# Patient Record
Sex: Female | Born: 1990 | Race: White | Hispanic: No | Marital: Single | Smoking: Never smoker
Health system: Southern US, Community
[De-identification: ages and names within clinical notes are randomized; demographics above are authoritative.]

## PROBLEM LIST (undated history)

## (undated) DIAGNOSIS — J45990 Exercise induced bronchospasm: Secondary | ICD-10-CM

## (undated) HISTORY — DX: Exercise induced bronchospasm: J45.990

## (undated) HISTORY — PX: NO PAST SURGERIES: SHX2092

## (undated) HISTORY — PX: TYMPANOPLASTY: SHX33

---

## 2016-09-08 ENCOUNTER — Encounter: Payer: Self-pay | Admitting: Obstetrics and Gynecology

## 2016-09-15 ENCOUNTER — Encounter: Payer: Self-pay | Admitting: Obstetrics and Gynecology

## 2016-09-29 ENCOUNTER — Encounter: Payer: Self-pay | Admitting: Obstetrics and Gynecology

## 2016-09-29 ENCOUNTER — Ambulatory Visit (INDEPENDENT_AMBULATORY_CARE_PROVIDER_SITE_OTHER): Payer: BC Managed Care – PPO | Admitting: Obstetrics and Gynecology

## 2016-09-29 VITALS — BP 114/77 | HR 71 | Ht 68.0 in | Wt 270.5 lb

## 2016-09-29 DIAGNOSIS — Z01419 Encounter for gynecological examination (general) (routine) without abnormal findings: Secondary | ICD-10-CM | POA: Diagnosis not present

## 2016-09-29 DIAGNOSIS — L729 Follicular cyst of the skin and subcutaneous tissue, unspecified: Secondary | ICD-10-CM

## 2016-09-29 DIAGNOSIS — Z6841 Body Mass Index (BMI) 40.0 and over, adult: Secondary | ICD-10-CM

## 2016-09-29 DIAGNOSIS — Z13220 Encounter for screening for lipoid disorders: Secondary | ICD-10-CM

## 2016-09-29 DIAGNOSIS — J4520 Mild intermittent asthma, uncomplicated: Secondary | ICD-10-CM | POA: Insufficient documentation

## 2016-09-29 DIAGNOSIS — Z8349 Family history of other endocrine, nutritional and metabolic diseases: Secondary | ICD-10-CM

## 2016-09-29 NOTE — Patient Instructions (Addendum)
Health Maintenance, Female Adopting a healthy lifestyle and getting preventive care can go a long way to promote health and wellness. Talk with your health care provider about what schedule of regular examinations is right for you. This is a good chance for you to check in with your provider about disease prevention and staying healthy. In between checkups, there are plenty of things you can do on your own. Experts have done a lot of research about which lifestyle changes and preventive measures are most likely to keep you healthy. Ask your health care provider for more information. Weight and diet Eat a healthy diet  Be sure to include plenty of vegetables, fruits, low-fat dairy products, and lean protein.  Do not eat a lot of foods high in solid fats, added sugars, or salt.  Get regular exercise. This is one of the most important things you can do for your health.  Most adults should exercise for at least 150 minutes each week. The exercise should increase your heart rate and make you sweat (moderate-intensity exercise).  Most adults should also do strengthening exercises at least twice a week. This is in addition to the moderate-intensity exercise. Maintain a healthy weight  Body mass index (BMI) is a measurement that can be used to identify possible weight problems. It estimates body fat based on height and weight. Your health care provider can help determine your BMI and help you achieve or maintain a healthy weight.  For females 76 years of age and older:  A BMI below 18.5 is considered underweight.  A BMI of 18.5 to 24.9 is normal.  A BMI of 25 to 29.9 is considered overweight.  A BMI of 30 and above is considered obese. Watch levels of cholesterol and blood lipids  You should start having your blood tested for lipids and cholesterol at 24 years of age, then have this test every 5 years.  You may need to have your cholesterol levels checked more often if:  Your lipid or  cholesterol levels are high.  You are older than 24 years of age.  You are at high risk for heart disease. Cancer screening Lung Cancer  Lung cancer screening is recommended for adults 64-42 years old who are at high risk for lung cancer because of a history of smoking.  A yearly low-dose CT scan of the lungs is recommended for people who:  Currently smoke.  Have quit within the past 15 years.  Have at least a 30-pack-year history of smoking. A pack year is smoking an average of one pack of cigarettes a day for 1 year.  Yearly screening should continue until it has been 15 years since you quit.  Yearly screening should stop if you develop a health problem that would prevent you from having lung cancer treatment. Breast Cancer  Practice breast self-awareness. This means understanding how your breasts normally appear and feel.  It also means doing regular breast self-exams. Let your health care provider know about any changes, no matter how small.  If you are in your 20s or 30s, you should have a clinical breast exam (CBE) by a health care provider every 1-3 years as part of a regular health exam.  If you are 34 or older, have a CBE every year. Also consider having a breast X-ray (mammogram) every year.  If you have a family history of breast cancer, talk to your health care provider about genetic screening.  If you are at high risk for breast cancer, talk  to your health care provider about having an MRI and a mammogram every year.  Breast cancer gene (BRCA) assessment is recommended for women who have family members with BRCA-related cancers. BRCA-related cancers include:  Breast.  Ovarian.  Tubal.  Peritoneal cancers.  Results of the assessment will determine the need for genetic counseling and BRCA1 and BRCA2 testing. Cervical Cancer  Your health care provider may recommend that you be screened regularly for cancer of the pelvic organs (ovaries, uterus, and vagina).  This screening involves a pelvic examination, including checking for microscopic changes to the surface of your cervix (Pap test). You may be encouraged to have this screening done every 3 years, beginning at age 24.  For women ages 66-65, health care providers may recommend pelvic exams and Pap testing every 3 years, or they may recommend the Pap and pelvic exam, combined with testing for human papilloma virus (HPV), every 5 years. Some types of HPV increase your risk of cervical cancer. Testing for HPV may also be done on women of any age with unclear Pap test results.  Other health care providers may not recommend any screening for nonpregnant women who are considered low risk for pelvic cancer and who do not have symptoms. Ask your health care provider if a screening pelvic exam is right for you.  If you have had past treatment for cervical cancer or a condition that could lead to cancer, you need Pap tests and screening for cancer for at least 20 years after your treatment. If Pap tests have been discontinued, your risk factors (such as having a new sexual partner) need to be reassessed to determine if screening should resume. Some women have medical problems that increase the chance of getting cervical cancer. In these cases, your health care provider may recommend more frequent screening and Pap tests. Colorectal Cancer  This type of cancer can be detected and often prevented.  Routine colorectal cancer screening usually begins at 24 years of age and continues through 24 years of age.  Your health care provider may recommend screening at an earlier age if you have risk factors for colon cancer.  Your health care provider may also recommend using home test kits to check for hidden blood in the stool.  A small camera at the end of a tube can be used to examine your colon directly (sigmoidoscopy or colonoscopy). This is done to check for the earliest forms of colorectal cancer.  Routine  screening usually begins at age 41.  Direct examination of the colon should be repeated every 5-10 years through 24 years of age. However, you may need to be screened more often if early forms of precancerous polyps or small growths are found. Skin Cancer  Check your skin from head to toe regularly.  Tell your health care provider about any new moles or changes in moles, especially if there is a change in a mole's shape or color.  Also tell your health care provider if you have a mole that is larger than the size of a pencil eraser.  Always use sunscreen. Apply sunscreen liberally and repeatedly throughout the day.  Protect yourself by wearing long sleeves, pants, a wide-brimmed hat, and sunglasses whenever you are outside. Heart disease, diabetes, and high blood pressure  High blood pressure causes heart disease and increases the risk of stroke. High blood pressure is more likely to develop in:  People who have blood pressure in the high end of the normal range (130-139/85-89 mm Hg).  People who are overweight or obese.  People who are African American.  If you are 59-24 years of age, have your blood pressure checked every 3-5 years. If you are 34 years of age or older, have your blood pressure checked every year. You should have your blood pressure measured twice-once when you are at a hospital or clinic, and once when you are not at a hospital or clinic. Record the average of the two measurements. To check your blood pressure when you are not at a hospital or clinic, you can use:  An automated blood pressure machine at a pharmacy.  A home blood pressure monitor.  If you are between 29 years and 60 years old, ask your health care provider if you should take aspirin to prevent strokes.  Have regular diabetes screenings. This involves taking a blood sample to check your fasting blood sugar level.  If you are at a normal weight and have a low risk for diabetes, have this test once  every three years after 24 years of age.  If you are overweight and have a high risk for diabetes, consider being tested at a younger age or more often. Preventing infection Hepatitis B  If you have a higher risk for hepatitis B, you should be screened for this virus. You are considered at high risk for hepatitis B if:  You were born in a country where hepatitis B is common. Ask your health care provider which countries are considered high risk.  Your parents were born in a high-risk country, and you have not been immunized against hepatitis B (hepatitis B vaccine).  You have HIV or AIDS.  You use needles to inject street drugs.  You live with someone who has hepatitis B.  You have had sex with someone who has hepatitis B.  You get hemodialysis treatment.  You take certain medicines for conditions, including cancer, organ transplantation, and autoimmune conditions. Hepatitis C  Blood testing is recommended for:  Everyone born from [birth year] through 1963.  Anyone with known risk factors for hepatitis C. Sexually transmitted infections (STIs)  You should be screened for sexually transmitted infections (STIs) including gonorrhea and chlamydia if:  You are sexually active and are younger than 24 years of age.  You are older than 24 years of age and your health care provider tells you that you are at risk for this type of infection.  Your sexual activity has changed since you were last screened and you are at an increased risk for chlamydia or gonorrhea. Ask your health care provider if you are at risk.  If you do not have HIV, but are at risk, it may be recommended that you take a prescription medicine daily to prevent HIV infection. This is called pre-exposure prophylaxis (PrEP). You are considered at risk if:  You are sexually active and do not regularly use condoms or know the HIV status of your partner(s).  You take drugs by injection.  You are sexually active with a partner  who has HIV. Talk with your health care provider about whether you are at high risk of being infected with HIV. If you choose to begin PrEP, you should first be tested for HIV. You should then be tested every 3 months for as long as you are taking PrEP. Pregnancy  If you are premenopausal and you may become pregnant, ask your health care provider about preconception counseling.  If you may become pregnant, take 400 to 800 micrograms (mcg) of folic acid  every day.  If you want to prevent pregnancy, talk to your health care provider about birth control (contraception). Osteoporosis and menopause  Osteoporosis is a disease in which the bones lose minerals and strength with aging. This can result in serious bone fractures. Your risk for osteoporosis can be identified using a bone density scan.  If you are 87 years of age or older, or if you are at risk for osteoporosis and fractures, ask your health care provider if you should be screened.  Ask your health care provider whether you should take a calcium or vitamin D supplement to lower your risk for osteoporosis.  Menopause may have certain physical symptoms and risks.  Hormone replacement therapy may reduce some of these symptoms and risks. Talk to your health care provider about whether hormone replacement therapy is right for you. Follow these instructions at home:  Schedule regular health, dental, and eye exams.  Stay current with your immunizations.  Do not use any tobacco products including cigarettes, chewing tobacco, or electronic cigarettes.  If you are pregnant, do not drink alcohol.  If you are breastfeeding, limit how much and how often you drink alcohol.  Limit alcohol intake to no more than 1 drink per day for nonpregnant women. One drink equals 12 ounces of beer, 5 ounces of wine, or 1 ounces of hard liquor.  Do not use street drugs.  Do not share needles.  Ask your health care provider for help if you need support  or information about quitting drugs.  Tell your health care provider if you often feel depressed.  Tell your health care provider if you have ever been abused or do not feel safe at home. This information is not intended to replace advice given to you by your health care provider. Make sure you discuss any questions you have with your health care provider. Document Released: 12/07/2010 Document Revised: 10/30/2015 Document Reviewed: 02/25/2015 Elsevier Interactive Patient Education  2017 [employer]    HPV (Human Papillomavirus) Vaccine: What You Need to Know 1. Why get vaccinated? HPV vaccine prevents infection with human papillomavirus (HPV) types that are associated with many cancers, including:  cervical cancer in females,  vaginal and vulvar cancers in females,  anal cancer in females and males,  throat cancer in females and males, and  penile cancer in males. In addition, HPV vaccine prevents infection with HPV types that cause genital warts in both females and males. In the [place] about 12,000 women get cervical cancer every year, and about 4,000 women die from it. HPV vaccine can prevent most of these cases of cervical cancer. Vaccination is not a substitute for cervical cancer screening. This vaccine does not protect against all HPV types that can cause cervical cancer. Women should still get regular Pap tests.  HPV infection usually comes from sexual contact, and most people will become infected at some point in their life. About 14 million Americans, including teens, get infected every year. Most infections will go away on their own and not cause serious problems. But thousands of women and men get cancer and other diseases from HPV. 2. HPV vaccine HPV vaccine is approved by FDA and is recommended by CDC for both males and females. It is routinely given at 13 or 24 years of age, but it may be given beginning at age 72 years through age 13 years. Most adolescents 9 through 24  years of age should get HPV vaccine as a two-dose series with the doses separated by  6-12 months. People who start HPV vaccination at 31 years of age and older should get the vaccine as a three-dose series with the second dose given 1-2 months after the first dose and the third dose given 6 months after the first dose. There are several exceptions to these age recommendations. Your health care provider can give you more information. 3. Some people should not get this vaccine  Anyone who has had a severe (life-threatening) allergic reaction to a dose of HPV vaccine should not get another dose.  Anyone who has a severe (life threatening) allergy to any component of HPV vaccine should not get the vaccine.  Tell your doctor if you have any severe allergies that you know of, including a severe allergy to yeast.  HPV vaccine is not recommended for pregnant women. If you learn that you were pregnant when you were vaccinated, there is no reason to expect any problems for you or your baby. Any woman who learns she was pregnant when she got HPV vaccine is encouraged to contact the manufacturer's registry for HPV vaccination during pregnancy at 3202727811. Women who are breastfeeding may be vaccinated.  If you have a mild illness, such as a cold, you can probably get the vaccine today. If you are moderately or severely ill, you should probably wait until you recover. Your doctor can advise you. 4. Risks of a vaccine reaction With any medicine, including vaccines, there is a chance of side effects. These are usually mild and go away on their own, but serious reactions are also possible. Most people who get HPV vaccine do not have any serious problems with it. Mild or moderate problems following HPV vaccine:   Reactions in the arm where the shot was given:  Soreness (about 9 people in 10)  Redness or swelling (about 1 person in 3)  Fever:  Mild (100F) (about 1 person in 10)  Moderate (102F)  (about 1 person in 34)  Other problems:  Headache (about 1 person in 3) Problems that could happen after any injected vaccine:   People sometimes faint after a medical procedure, including vaccination. Sitting or lying down for about 15 minutes can help prevent fainting, and injuries caused by a fall. Tell your doctor if you feel dizzy, or have vision changes or ringing in the ears.  Some people get severe pain in the shoulder and have difficulty moving the arm where a shot was given. This happens very rarely.  Any medication can cause a severe allergic reaction. Such reactions from a vaccine are very rare, estimated at about 1 in a million doses, and would happen within a few minutes to a few hours after the vaccination. As with any medicine, there is a very remote chance of a vaccine causing a serious injury or death. The safety of vaccines is always being monitored. For more information, visit: http://www.aguilar.org/. 5. What if there is a serious reaction? What should I look for?  Look for anything that concerns you, such as signs of a severe allergic reaction, very high fever, or unusual behavior. Signs of a severe allergic reaction can include hives, swelling of the face and throat, difficulty breathing, a fast heartbeat, dizziness, and weakness. These would usually start a few minutes to a few hours after the vaccination. What should I do?  If you think it is a severe allergic reaction or other emergency that can't wait, call 9-1-1 or get to the nearest hospital. Otherwise, call your doctor. Afterward, the reaction should be  reported to the Vaccine Adverse Event Reporting System (VAERS). Your doctor should file this report, or you can do it yourself through the VAERS web site at www.vaers.SamedayNews.es, or by calling 417-794-9018. VAERS does not give medical advice.  6. The National Vaccine Injury Compensation Program The [employer] Vaccine Injury Compensation Program (VICP) is a federal  program that was created to compensate people who may have been injured by certain vaccines. Persons who believe they may have been injured by a vaccine can learn about the program and about filing a claim by calling (785) 753-1155 or visiting the [place] website at GoldCloset.com.ee. There is a time limit to file a claim for compensation. 7. How can I learn more?  Ask your health care provider. He or she can give you the vaccine package insert or suggest other sources of information.  Call your local or state health department.  Contact the Centers for Disease Control and Prevention (CDC):  Call 9801662519 (1-800-CDC-INFO) or  Visit CDC's website at http://sweeney-todd.com/ Vaccine Information Statement, HPV Vaccine (05/09/2015) This information is not intended to replace advice given to you by your health care provider. Make sure you discuss any questions you have with your health care provider. Document Released: 12/19/2013 Document Revised: 02/12/2016 Document Reviewed: 02/12/2016 Elsevier Interactive Patient Education  2017 [employer].

## 2016-09-29 NOTE — Progress Notes (Signed)
GYNECOLOGY ANNUAL PHYSICAL EXAM PROGRESS NOTE  Subjective:    Brittany Love is a 26 y.o. G0P0000 female who presents for an annual exam.  The patient is not currently sexually active. The patient wears seatbelts: yes. The patient participates in regular exercise: yes (4-5 times weekly). Has the patient ever been transfused or tattooed?: no. The patient reports that there is not domestic violence in her life.   The patient has the following complaints today:  1. Patient reports a vaginal bump near bottom of left labia, has been present for ~ 2 months.  Denies discharge from lesion.  Bump is mostly non-tender (unless pressed on firmly). Denies recent shaving.   Gynecologic History Menarche age: 42 Patient's last menstrual period was 09/08/2016 (exact date). Period Cycle (Days): 28 Period Duration (Days): 5-7 Period Pattern: Regular Menstrual Flow: Moderate Dysmenorrhea: (!) Severe Dysmenorrhea Symptoms: Cramping  Contraception: none History of STI's: Denies  Last Pap: 07/2015. Results were: normal.  Denies h/o abnormal pap smears.   Obstetric History   G0   P0   T0   P0   A0   L0    SAB0   TAB0   Ectopic0   Multiple0   Live Births0       Past Medical History:  Diagnosis Date  . Asthma, exercise induced     Past Surgical History:  Procedure Laterality Date  . NO PAST SURGERIES      Family History  Problem Relation Age of Onset  . Hyperlipidemia Father   . Thyroid disease Father   . Diabetes Neg Hx   . Hypertension Neg Hx   . Ovarian cancer Neg Hx     Social History   Social History  . Marital status: Single    Spouse name: N/A  . Number of children: N/A  . Years of education: N/A   Occupational History  . Not on file.   Social History Main Topics  . Smoking status: Never Smoker  . Smokeless tobacco: Never Used  . Alcohol use No  . Drug use: No  . Sexual activity: No   Other Topics Concern  . Not on file   Social History Narrative  . No  narrative on file    No current outpatient prescriptions on file prior to visit.   No current facility-administered medications on file prior to visit.     No Known Allergies   Review of Systems Constitutional: negative for chills, fatigue, fevers and sweats Eyes: negative for irritation, redness and visual disturbance Ears, nose, mouth, throat, and face: negative for hearing loss, nasal congestion, snoring and tinnitus Respiratory: negative for asthma, cough, sputum Cardiovascular: negative for chest pain, dyspnea, exertional chest pressure/discomfort, irregular heart beat, palpitations and syncope Gastrointestinal: negative for abdominal pain, change in bowel habits, nausea and vomiting Genitourinary: negative for abnormal menstrual periods, genital lesions, sexual problems and vaginal discharge, dysuria and urinary incontinence Integument/breast: negative for breast lump, breast tenderness and nipple discharge Hematologic/lymphatic: negative for bleeding and easy bruising Musculoskeletal:negative for back pain and muscle weakness Neurological: negative for dizziness, headaches, vertigo and weakness Endocrine: negative for diabetic symptoms including polydipsia, polyuria and skin dryness Allergic/Immunologic: negative for hay fever and urticaria       Objective:  Blood pressure 114/77, pulse 71, height  (1.727 m), weight 270 lb 8 oz (122.7 kg), last menstrual period 09/08/2016. Body mass index is 41.13 kg/m.   General Appearance:    Alert, cooperative, no distress, appears stated age, morbid obesity  Head:    Normocephalic, without obvious abnormality, atraumatic  Eyes:    PERRL, conjunctiva/corneas clear, EOM's intact, both eyes  Ears:    Normal external ear canals, both ears  Nose:   Nares normal, septum midline, mucosa normal, no drainage or sinus tenderness  Throat:   Lips, mucosa, and tongue normal; teeth and gums normal  Neck:   Supple, symmetrical, trachea  midline, no adenopathy; thyroid: no enlargement/tenderness/nodules; no carotid bruit or JVD  Back:     Symmetric, no curvature, ROM normal, no CVA tenderness  Lungs:     Clear to auscultation bilaterally, respirations unlabored  Chest Wall:    No tenderness or deformity   Heart:    Regular rate and rhythm, S1 and S2 normal, no murmur, rub or gallop  Breast Exam:    No tenderness, masses, or nipple abnormality  Abdomen:     Soft, non-tender, bowel sounds active all four quadrants, no masses, no organomegaly.    Genitalia:    Pelvic:external genitalia normal.  Small palpable nodule ~ 0.25 cm beneath skin at base of left labia majora.  Vagina without lesions, discharge, or tenderness, rectovaginal septum  normal. Cervix normal in appearance, no cervical motion tenderness, no adnexal masses or tenderness.  Uterus normal size, shape, mobile, regular contours, nontender.  Rectal:    Normal external sphincter.  No hemorrhoids appreciated. Internal exam not done.   Extremities:   Extremities normal, atraumatic, no cyanosis or edema  Pulses:   2+ and symmetric all extremities  Skin:   Skin color, texture, turgor normal, no rashes or lesions  Lymph nodes:   Cervical, supraclavicular, and axillary nodes normal  Neurologic:   CNII-XII intact, normal strength, sensation and reflexes throughout    Labs:  No results found for: WBC, HGB, HCT, MCV, PLT  No results found for: CREATININE, BUN, NA, K, CL, CO2  No results found for: ALT, AST, GGT, ALKPHOS, BILITOT  No results found for: TSH   Assessment:    Healthy female exam.   Subcutaneous cyst  Morbid obesity Family h/o thyroid disease  Plan:     Blood tests: CBC with diff, Comprehensive metabolic panel, Lipoproteins and TSH. Breast self exam technique reviewed and patient encouraged to perform self-exam monthly. Contraception: abstinence. Discussed healthy lifestyle modifications. Pap smear up to date. Discussed HPV vaccine.  Handout given.    Follow up in 1 year.   Hildred Laser, MD [facility]

## 2016-10-01 ENCOUNTER — Other Ambulatory Visit: Payer: Self-pay | Admitting: Obstetrics and Gynecology

## 2016-10-01 ENCOUNTER — Other Ambulatory Visit: Payer: BC Managed Care – PPO

## 2016-10-04 ENCOUNTER — Telehealth: Payer: Self-pay

## 2016-10-04 LAB — COMPREHENSIVE METABOLIC PANEL
A/G RATIO: 1.6 (ref 1.2–2.2)
ALBUMIN: 4.2 g/dL (ref 3.5–5.5)
ALK PHOS: 68 IU/L (ref 39–117)
ALT: 21 IU/L (ref 0–32)
AST: 22 IU/L (ref 0–40)
BILIRUBIN TOTAL: 0.2 mg/dL (ref 0.0–1.2)
BUN / CREAT RATIO: 22 (ref 9–23)
BUN: 16 mg/dL (ref 6–20)
CHLORIDE: 101 mmol/L (ref 96–106)
CO2: 25 mmol/L (ref 18–29)
Calcium: 9 mg/dL (ref 8.7–10.2)
Creatinine, Ser: 0.74 mg/dL (ref 0.57–1.00)
GFR calc Af Amer: 132 mL/min/{1.73_m2} (ref >59)
GFR calc non Af Amer: 115 mL/min/{1.73_m2} (ref >59)
GLUCOSE: 90 mg/dL (ref 65–99)
Globulin, Total: 2.6 g/dL (ref 1.5–4.5)
POTASSIUM: 4.7 mmol/L (ref 3.5–5.2)
Sodium: 138 mmol/L (ref 134–144)
Total Protein: 6.8 g/dL (ref 6.0–8.5)

## 2016-10-04 LAB — CBC
HEMATOCRIT: 39.7 % (ref 34.0–46.6)
Hemoglobin: 12.7 g/dL (ref 11.1–15.9)
MCH: 26.2 pg — ABNORMAL LOW (ref 26.6–33.0)
MCHC: 32 g/dL (ref 31.5–35.7)
MCV: 82 fL (ref 79–97)
Platelets: 236 10*3/uL (ref 150–379)
RBC: 4.84 x10E6/uL (ref 3.77–5.28)
RDW: 14.3 % (ref 12.3–15.4)
WBC: 6.3 10*3/uL (ref 3.4–10.8)

## 2016-10-04 LAB — LIPID PANEL
CHOL/HDL RATIO: 4.2 ratio (ref 0.0–4.4)
CHOLESTEROL TOTAL: 194 mg/dL (ref 100–199)
HDL: 46 mg/dL (ref >39)
LDL Calculated: 129 mg/dL — ABNORMAL HIGH (ref 0–99)
TRIGLYCERIDES: 93 mg/dL (ref 0–149)
VLDL Cholesterol Cal: 19 mg/dL (ref 5–40)

## 2016-10-04 LAB — TSH: TSH: 2.3 u[IU]/mL (ref 0.450–4.500)

## 2016-10-04 NOTE — Telephone Encounter (Signed)
-----   Message from Hildred Laser, MD sent at 10/04/2016  1:56 PM EDT ----- Please inform patient of normal labs except slightly elevated LDL cholesterol level.  Encourage low-cholesterol diet, exercise at least 30 min daily 3-5 days weekly, and also could benefit from taking a fish-oil supplement daily.

## 2016-10-04 NOTE — Telephone Encounter (Signed)
Called pt no answer. LM for pt informing her of information below. Also sent text for my chart sign up.  

## 2016-10-05 ENCOUNTER — Telehealth: Payer: Self-pay | Admitting: Obstetrics and Gynecology

## 2016-10-05 NOTE — Telephone Encounter (Signed)
Patient lvm wanting to discuss LDL because she has tried lifestyle changes in the past, and believes that its possible it is genetic, And wants to discuss it. Please advise.

## 2016-10-05 NOTE — Telephone Encounter (Signed)
Called pt no answer. Lm for pt informing her that if her LDL was not managed by diet and exercise she would need referral to PCP for management.

## 2017-05-04 ENCOUNTER — Telehealth: Payer: Self-pay | Admitting: Obstetrics and Gynecology

## 2017-05-04 DIAGNOSIS — F419 Anxiety disorder, unspecified: Secondary | ICD-10-CM

## 2017-05-04 NOTE — Telephone Encounter (Signed)
Patient desires a referral to an endocrinologist (as she believes her cholesterol issues are not related just to diet alone, desires to see if there is a hereditary component).

## 2017-05-04 NOTE — Telephone Encounter (Signed)
Spoke with pt and she states her last visit it was discussed about her getting a Veterinary surgeoncounselor. She states she was given the name of Jamal CollinJulie Tabori but she is unable to reach her. I tried to look over her ov note to get further insight, no information I can see.  Dr. Valentino Saxonherry can you advise

## 2017-05-04 NOTE — Telephone Encounter (Signed)
The patient called and stated that she would like to have some more recommendations of counselors that she would be able to use. No other information disclosed. Please advise.

## 2017-05-06 NOTE — Telephone Encounter (Signed)
Dr. Valentino Saxonherry, I spoke with pt and she states she was looking for another sociologist then the one from previous message. She is aware you are not in office today.

## 2017-05-10 ENCOUNTER — Other Ambulatory Visit: Payer: Self-pay

## 2017-05-10 NOTE — Telephone Encounter (Signed)
We can put in a referral for psychiatry.

## 2017-05-11 ENCOUNTER — Encounter: Payer: Self-pay | Admitting: Podiatry

## 2017-05-11 ENCOUNTER — Ambulatory Visit: Payer: Self-pay | Admitting: Podiatry

## 2017-05-11 VITALS — BP 136/86 | HR 86 | Temp 96.0°F

## 2017-05-11 DIAGNOSIS — L600 Ingrowing nail: Secondary | ICD-10-CM

## 2017-05-11 MED ORDER — NEOMYCIN-POLYMYXIN-HC 1 % OT SOLN: OTIC | 1 refills | Status: DC

## 2017-05-11 NOTE — Telephone Encounter (Signed)
Spoke with pt and she stated she preferred a referral to psychology instead. Referral placed. AC this is a Financial plannerfyi

## 2017-05-11 NOTE — Progress Notes (Signed)
She presents today chief complaint of ingrown nail fibular border of the hallux right times the past week or so.  She states she is noticed some slight drainage and swelling and tenderness about the toe.  Objective: Vital signs are stable alert and oriented x3.  Granulation tissue is growing over the lateral border of the nail.  Ears to be sharp and incurvated but it could be associated with the juxtaposition of the second toe.  Assessment: Ingrown nail paronychia lateral hallux right.  Plan: Chemical matrixectomy was performed today after local anesthesia was administered she tolerated procedure well without complications.  She was provided both oral and written home-going instructions as well as a prescription for Cortisporin Otic to be applied twice a day after soaking.  Follow-up with her in 1-2 weeks for reevaluation.

## 2017-05-11 NOTE — Patient Instructions (Signed)

## 2017-05-23 NOTE — Telephone Encounter (Signed)
Per staff message sent to me:  Slaugenhaupt, Terri L, CCC-SLP  [place], [place]asmine L, [place]MA        FYI: Your patient is scheduled to see Merry LoftyBambi Cottle, LCSW on 06/28/17. Pt is aware.   Thank you for the referral.

## 2017-05-25 ENCOUNTER — Encounter: Payer: Self-pay | Admitting: Podiatry

## 2017-05-25 ENCOUNTER — Ambulatory Visit (INDEPENDENT_AMBULATORY_CARE_PROVIDER_SITE_OTHER): Payer: BC Managed Care – PPO | Admitting: Podiatry

## 2017-05-25 DIAGNOSIS — L600 Ingrowing nail: Secondary | ICD-10-CM

## 2017-05-25 NOTE — Progress Notes (Signed)
She presents today for follow-up of her matrixectomy fibular border hallux right.  He states that she is doing very well and continues to soak in the Betadine and warm water on a daily basis.  She applies Cortisporin otic as directed.  States that he feels much better than it did prior to surgery.  She is having very little pain with it.  Objective: Vital signs are stable she is alert and oriented x3 pulses are palpable.  Toe right hallux fibular border does not demonstrate no erythema cellulitis drainage or odor.  Granulation tissue and epithelialization is occurring from deep to superficial.  Assessment: Well-healing surgical toe.  Plan: Discontinue Betadine soaks and start with Epsom salts and warm water soaks daily continue Cortisporin Otic as directed, during the day and leave open at bedtime.  She will continue to soak until there is no drainage no redness in the soreness.  She will call with any questions or concerns or worsening of her condition.

## 2017-06-28 ENCOUNTER — Ambulatory Visit: Payer: BC Managed Care – PPO | Admitting: Psychology

## 2017-06-29 ENCOUNTER — Ambulatory Visit: Payer: BC Managed Care – PPO | Admitting: Psychology

## 2017-06-29 DIAGNOSIS — F411 Generalized anxiety disorder: Secondary | ICD-10-CM

## 2017-07-18 ENCOUNTER — Ambulatory Visit: Payer: BC Managed Care – PPO | Admitting: Psychology

## 2017-07-18 DIAGNOSIS — F410 Panic disorder [episodic paroxysmal anxiety] without agoraphobia: Secondary | ICD-10-CM | POA: Diagnosis not present

## 2017-07-19 ENCOUNTER — Ambulatory Visit: Payer: BC Managed Care – PPO | Admitting: Podiatry

## 2017-07-21 ENCOUNTER — Ambulatory Visit: Payer: BC Managed Care – PPO | Admitting: Podiatry

## 2017-07-21 DIAGNOSIS — L600 Ingrowing nail: Secondary | ICD-10-CM

## 2017-07-21 NOTE — Patient Instructions (Signed)
Place 1/4 cup of epsom salts in a quart of warm tap water.  Submerge your foot or feet in the solution and soak for 20 minutes.  This soak should be done twice a day.  Next, remove your foot or feet from solution, blot dry the affected area. Apply ointment and cover if instructed by your doctor.   IF YOUR SKIN BECOMES IRRITATED WHILE USING THESE INSTRUCTIONS, IT IS OKAY TO SWITCH TO  WHITE VINEGAR AND WATER.  As another alternative soak, you may use antibacterial soap and water.  Monitor for any signs/symptoms of infection. Call the office immediately if any occur or go directly to the emergency room. Call with any questions/concerns.  Ingrown Toenail An ingrown toenail occurs when the corner or sides of your toenail grow into the surrounding skin. The big toe is most commonly affected, but it can happen to any of your toes. If your ingrown toenail is not treated, you will be at risk for infection. What are the causes? This condition may be caused by:  Wearing shoes that are too small or tight.  Injury or trauma, such as stubbing your toe or having your toe stepped on.  Improper cutting or care of your toenails.  Being born with (congenital) nail or foot abnormalities, such as having a nail that is too big for your toe.  What increases the risk? Risk factors for an ingrown toenail include:  Age. Your nails tend to thicken as you get older, so ingrown nails are more common in older people.  Diabetes.  Cutting your toenails incorrectly.  Blood circulation problems.  What are the signs or symptoms? Symptoms may include:  Pain, soreness, or tenderness.  Redness.  Swelling.  Hardening of the skin surrounding the toe.  Your ingrown toenail may be infected if there is fluid, pus, or drainage. How is this diagnosed? An ingrown toenail may be diagnosed by medical history and physical exam. If your toenail is infected, your health care provider may test a sample of the  drainage. How is this treated? Treatment depends on the severity of your ingrown toenail. Some ingrown toenails may be treated at home. More severe or infected ingrown toenails may require surgery to remove all or part of the nail. Infected ingrown toenails may also be treated with antibiotic medicines. Follow these instructions at home:  If you were prescribed an antibiotic medicine, finish all of it even if you start to feel better.  Soak your foot in warm soapy water for 20 minutes, 3 times per day or as directed by your health care provider.  Carefully lift the edge of the nail away from the sore skin by wedging a small piece of cotton under the corner of the nail. This may help with the pain. Be careful not to cause more injury to the area.  Wear shoes that fit well. If your ingrown toenail is causing you pain, try wearing sandals, if possible.  Trim your toenails regularly and carefully. Do not cut them in a curved shape. Cut your toenails straight across. This prevents injury to the skin at the corners of the toenail.  Keep your feet clean and dry.  If you are having trouble walking and are given crutches by your health care provider, use them as directed.  Do not pick at your toenail or try to remove it yourself.  Take medicines only as directed by your health care provider.  Keep all follow-up visits as directed by your health care provider. This   is important. Contact a health care provider if:  Your symptoms do not improve with treatment. Get help right away if:  You have red streaks that start at your foot and go up your leg.  You have a fever.  You have increased redness, swelling, or pain.  You have fluid, blood, or pus coming from your toenail. This information is not intended to replace advice given to you by your health care provider. Make sure you discuss any questions you have with your health care provider. Document Released: 05/21/2000 Document Revised:  10/24/2015 Document Reviewed: 04/17/2014 Elsevier Interactive Patient Education  2018 [employer]  

## 2017-07-23 NOTE — Progress Notes (Signed)
She presents today for follow-up of her matrixectomy fibular border hallux right.  States that she was doing fine and then had to do a lot of walking and started to experience some pain and noticed that she has a subtle area of tissue overlapping the nail now that is painful and bleeds easily.  Objective: Vital signs are stable she is alert and oriented x3.  Pulses are palpable.  Neurologic sensorium is intact.  Cutaneous evaluation does demonstrate a small granuloma fibular border of the hallux right.  Other than that there is no other erythema or cellulitis or drainage or anything that would make us think that this is infected.  Assessment: Granuloma laying over the lateral aspect of the nail plate.  Suspected a small spicule of nail may be causing this.  Plan: Chemical matrixectomy was performed once again to the lateral border we coded as a I&D because I had to use the phenol to kill the granuloma after the resection.  I [place] will of nail present this is performed after local anesthetic consisting of a 50-50 Mr. Marcaine plain lidocaine plain was infiltrated in a hallux block.  She tolerated procedure well dressed a compressive dressing was applied she was given both oral and written home-going instructions for care and soaking of her toes well.  I will up with her in a week or 2.

## 2017-07-25 ENCOUNTER — Ambulatory Visit: Payer: BC Managed Care – PPO | Admitting: Psychology

## 2017-08-22 ENCOUNTER — Ambulatory Visit: Payer: BC Managed Care – PPO | Admitting: Psychology

## 2017-08-29 ENCOUNTER — Ambulatory Visit: Payer: BC Managed Care – PPO | Admitting: Psychology

## 2017-08-29 DIAGNOSIS — F411 Generalized anxiety disorder: Secondary | ICD-10-CM | POA: Diagnosis not present

## 2017-09-14 ENCOUNTER — Ambulatory Visit: Payer: BC Managed Care – PPO | Admitting: Podiatry

## 2017-09-14 ENCOUNTER — Ambulatory Visit (INDEPENDENT_AMBULATORY_CARE_PROVIDER_SITE_OTHER): Payer: BC Managed Care – PPO

## 2017-09-14 ENCOUNTER — Encounter: Payer: Self-pay | Admitting: Podiatry

## 2017-09-14 DIAGNOSIS — M779 Enthesopathy, unspecified: Secondary | ICD-10-CM

## 2017-09-14 DIAGNOSIS — M778 Other enthesopathies, not elsewhere classified: Secondary | ICD-10-CM

## 2017-09-14 DIAGNOSIS — M722 Plantar fascial fibromatosis: Secondary | ICD-10-CM

## 2017-09-14 DIAGNOSIS — M7751 Other enthesopathy of right foot: Secondary | ICD-10-CM | POA: Diagnosis not present

## 2017-09-14 DIAGNOSIS — M2580 Other specified joint disorders, unspecified joint: Secondary | ICD-10-CM

## 2017-09-14 MED ORDER — METHYLPREDNISOLONE 4 MG PO TBPK: ORAL_TABLET | ORAL | 0 refills | Status: DC

## 2017-09-14 NOTE — Progress Notes (Signed)
She presents today with a chief complaint of pain to her right ankle her first metatarsophalangeal joint of the right foot plantarly as well as her left heel.  She states that the ankle was hurting for a while but seems to be doing much better.  Objective: Vital signs are stable she is alert and oriented x3.  Pulses are palpable.  She has pain to palpation medial calcaneal tubercle of the left heel.  She has tenderness on palpation of the fibular sesamoid right first metatarsophalangeal joint.  Radiograph bilaterally demonstrate only soft tissue increase in density of plantar fascial calcaneal insertion site of left heel normal architecture of the first metatarsophalangeal joint right.  Assessment: Fibular sesamoiditis right.  Plantar fasciitis left.  Plan: Discussed etiology pathology conservative versus surgical therapies at this point time I injected 20 mg Kenalog 5 mg Marcaine to the point of maximal tenderness of left heel after sterile Betadine skin prep.  I also injected the dorsal aspect of the right foot through the foot to the fibular sesamoid with 20 mg of Kenalog 5 mg Marcaine after sterile Betadine skin prep.  We discussed appropriate shoe gear stretching exercises ice therapy as your modifications placed her nonsteroidal anti-inflammatory we will follow-up with her in 1 month if necessary.

## 2017-09-19 ENCOUNTER — Ambulatory Visit: Payer: BC Managed Care – PPO | Admitting: Psychology

## 2017-10-04 ENCOUNTER — Encounter: Payer: BC Managed Care – PPO | Admitting: Obstetrics and Gynecology

## 2017-10-13 ENCOUNTER — Ambulatory Visit: Payer: BC Managed Care – PPO | Admitting: Podiatry

## 2017-10-14 ENCOUNTER — Ambulatory Visit: Payer: BC Managed Care – PPO | Admitting: Psychology

## 2017-10-14 DIAGNOSIS — F411 Generalized anxiety disorder: Secondary | ICD-10-CM | POA: Diagnosis not present

## 2017-10-17 ENCOUNTER — Ambulatory Visit: Payer: BC Managed Care – PPO | Admitting: Psychology

## 2017-11-09 ENCOUNTER — Ambulatory Visit: Payer: BC Managed Care – PPO | Admitting: Obstetrics and Gynecology

## 2017-11-09 ENCOUNTER — Encounter: Payer: Self-pay | Admitting: Obstetrics and Gynecology

## 2017-11-09 VITALS — BP 124/83 | HR 62 | Ht 68.0 in | Wt 251.8 lb

## 2017-11-09 DIAGNOSIS — N926 Irregular menstruation, unspecified: Secondary | ICD-10-CM | POA: Diagnosis not present

## 2017-11-09 DIAGNOSIS — L700 Acne vulgaris: Secondary | ICD-10-CM | POA: Diagnosis not present

## 2017-11-09 MED ORDER — DROSPIRENONE-ETHINYL ESTRADIOL 3-0.02 MG PO TABS: 1.0000 | ORAL_TABLET | Freq: Every day | ORAL | 11 refills | Status: DC

## 2017-11-09 NOTE — Progress Notes (Signed)
    GYNECOLOGY PROGRESS NOTE  Subjective:    Patient ID: Johny SaxElizabeth West, female    DOB: 01/11/1991, 27 y.o.   MRN: [id number]  HPI  Patient is a 27 y.o. G0P0000 female who presents for complaints of abnormal cycles.  She reports that she used to have regular, but somewhat heavy cycles; now periods have been coming twice a month for the past 2-3 months with second period being slightly lighter but lasts about the same.  Cycles last 5 days.  Notes mild dysmenorrhea. Of note, patient states that she did have steroid shots 3 months ago for her foot (plantar fasciitis), and wonders if this could have caused her menstrual irregularities.   In addition, patient notes that her acne has worsened over the past 3 months.   Lastly, patient states that she noted a growth near the entrance of her vagina several days ago. Unsure how long it has been there.  Is not painful or tender, denies discharge from lesion. Patient notes that she is not currently sexually active.    The following portions of the patient's history were reviewed and updated as appropriate: allergies, current medications, past family history, past medical history, past social history, past surgical history and problem list.  Review of Systems Pertinent items noted in HPI and remainder of comprehensive ROS otherwise negative.   Objective:   Blood pressure 124/83, pulse 62, height 5\' 8"  (1.727 m), weight 251 lb 12.8 oz (114.2 kg), last menstrual period 11/07/2017. General appearance: alert and no distress Abdomen: soft, non-tender; bowel sounds normal; no masses,  no organomegaly Pelvic: external genitalia normal, rectovaginal septum normal.  Vagina with small amount of dark red blood vaginal vault. discharge.  Cervix normal appearing, no lesions and no motion tenderness.  Uterus mobile, nontender, normal shape and size.  Adnexae non-palpable, nontender bilaterally.  Extremities: extremities normal, atraumatic, no cyanosis or  edema Neurologic: Grossly normal   Assessment:   Dysfunctional uterine bleeding Acne  Plan:   1. Dysfunctional uterine bleeding - discussed bleeding with patient. Advised that it is possible that the use of steroids could affect her menstrual cycles, however could also be related to hormonal fluctuations.  Will check labs to rule out hormonal disorders such as PCOS.  Discussed use of hormonal contraception to help regulate her cycles again, as well as help with her acne.  Started on Yaz. Based on labs, will also determine if an ultrasound is needed.  2. Acne - see above.  3. Reassurance given regarding vaginal growth as no visible lesions noted today. Patient notes that is not currently sexually active. Denies h/o STI's, declines testing today.   Follow up in 2 months to reassess cycles and acne.    Hildred Laserherry, Misti Towle, MD [facility]

## 2017-11-09 NOTE — Patient Instructions (Addendum)
Polycystic Ovarian Syndrome Polycystic ovarian syndrome (PCOS) is a common hormonal disorder among women of reproductive age. In most women with PCOS, many small fluid-filled sacs (cysts) grow on the ovaries, and the cysts are not part of a normal menstrual cycle. PCOS can cause problems with your menstrual periods and make it difficult to get pregnant. It can also cause an increased risk of miscarriage with pregnancy. If it is not treated, PCOS can lead to serious health problems, such as diabetes and heart disease. What are the causes? The cause of PCOS is not known, but it may be the result of a combination of certain factors, such as:  Irregular menstrual cycle.  High levels of certain hormones (androgens).  Problems with the hormone that helps to control blood sugar (insulin resistance).  Certain genes.  What increases the risk? This condition is more likely to develop in women who have a family history of PCOS. What are the signs or symptoms? Symptoms of PCOS may include:  Multiple ovarian cysts.  Infrequent periods or no periods.  Periods that are too frequent or too heavy.  Unpredictable periods.  Inability to get pregnant (infertility) because of not ovulating.  Increased growth of hair on the face, chest, stomach, back, thumbs, thighs, or toes.  Acne or oily skin. Acne may develop during adulthood, and it may not respond to treatment.  Pelvic pain.  Weight gain or obesity.  Patches of thickened and dark brown or black skin on the neck, arms, breasts, or thighs (acanthosis nigricans).  Excess hair growth on the face, chest, abdomen, or upper thighs (hirsutism).  How is this diagnosed? This condition is diagnosed based on:  Your medical history.  A physical exam, including a pelvic exam. Your health care provider may look for areas of increased hair growth on your skin.  Tests, such as: ? Ultrasound. This may be used to examine the ovaries and the lining of  the uterus (endometrium) for cysts. ? Blood tests. These may be used to check levels of sugar (glucose), female hormone (testosterone), and female hormones (estrogen and progesterone) in your blood.  How is this treated? There is no cure for PCOS, but treatment can help to manage symptoms and prevent more health problems from developing. Treatment varies depending on:  Your symptoms.  Whether you want to have a baby or whether you need birth control (contraception).  Treatment may include nutrition and lifestyle changes along with:  Progesterone hormone to start a menstrual period.  Birth control pills to help you have regular menstrual periods.  Medicines to make you ovulate, if you want to get pregnant.  Medicine to reduce excessive hair growth.  Surgery, in severe cases. This may involve making small holes in one or both of your ovaries. This decreases the amount of testosterone that your body produces.  Follow these instructions at home:  Take over-the-counter and prescription medicines only as told by your health care provider.  Follow a healthy meal plan. This can help you reduce the effects of PCOS. ? Eat a healthy diet that includes lean proteins, complex carbohydrates, fresh fruits and vegetables, low-fat dairy products, and healthy fats. Make sure to eat enough fiber.  If you are overweight, lose weight as told by your health care provider. ? Losing 10% of your body weight may improve symptoms. ? Your health care provider can determine how much weight loss is best for you and can help you lose weight safely.  Keep all follow-up visits as told by   your health care provider. This is important. Contact a health care provider if:  Your symptoms do not get better with medicine.  You develop new symptoms. This information is not intended to replace advice given to you by your health care provider. Make sure you discuss any questions you have with your health care  provider. Document Released: 09/17/2004 Document Revised: 01/20/2016 Document Reviewed: 11/09/2015 Elsevier Interactive Patient Education  2018 Elsevier Inc.  Drospirenone; Ethinyl Estradiol tablets What is this medicine? DROSPIRENONE; ETHINYL ESTRADIOL (dro SPY re nown; ETH in il es tra DYE ole) is an oral contraceptive (birth control pill). This medicine combines two types of female hormones, an estrogen and a progestin. It is used to prevent ovulation and pregnancy. This medicine may be used for other purposes; ask your health care provider or pharmacist if you have questions. COMMON BRAND NAME(S): Gianvi, Loryna, Nikki 28-Day, Ocella, Syeda, Vestura, Yasmin, Yaz, Zarah What should I tell my health care provider before I take this medicine? They need to know if you have or ever had any of these conditions: -abnormal vaginal bleeding -adrenal gland disease -blood vessel disease or blood clots -breast, cervical, endometrial, ovarian, liver, or uterine cancer -diabetes -gallbladder disease -heart disease or recent heart attack -high blood pressure -high cholesterol -high potassium level -kidney disease -liver disease -migraine headaches -stroke -systemic lupus erythematosus (SLE) -tobacco smoker -an unusual or allergic reaction to estrogens, progestins, or other medicines, foods, dyes, or preservatives -pregnant or trying to get pregnant -breast-feeding How should I use this medicine? Take this medicine by mouth. To reduce nausea, this medicine may be taken with food. Follow the directions on the prescription label. Take this medicine at the same time each day and in the order directed on the package. Do not take your medicine more often than directed. A patient package insert for the product will be given with each prescription and refill. Read this sheet carefully each time. The sheet may change frequently. Talk to your pediatrician regarding the use of this medicine in children.  Special care may be needed. This medicine has been used in female children who have started having menstrual periods. Overdosage: If you think you have taken too much of this medicine contact a poison control center or emergency room at once. NOTE: This medicine is only for you. Do not share this medicine with others. What if I miss a dose? If you miss a dose, refer to the patient information sheet you received with your medicine for direction. If you miss more than one pill, this medicine may not be as effective and you may need to use another form of birth control. What may interact with this medicine? Do not take this medicine with any of the following medications: -aminoglutethimide -amprenavir, fosamprenavir -atazanavir; cobicistat -anastrozole -bosentan -exemestane -letrozole -metyrapone -testolactone This medicine may also interact with the following medications: -acetaminophen -antiviral medicines for HIV or AIDS -aprepitant -barbiturates -certain antibiotics like rifampin, rifabutin, rifapentine, and possibly penicillins or tetracyclines -certain diuretics like amiloride, spironolactone, triamterene -certain medicines for fungal infections like griseofulvin, ketoconazole, itraconazole -certain medications for high blood pressure or heart conditions like ACE-inhibitors, Angiotensin-II receptor blockers, eplerenone -certain medicines for seizures like carbamazepine, oxcarbazepine, phenobarbital, phenytoin -cholestyramine -cobicistat -corticosteroid like hydrocortisone and prednisolone -cyclosporine -dantrolene -felbamate -grapefruit juice -heparin -lamotrigine -medicines for diabetes, including pioglitazone -modafinil -NSAIDs -potassium supplements -pyrimethamine -raloxifene -St. John's wort -sulfasalazine -tamoxifen -topiramate -thyroid hormones -warfarin his list may not describe all possible interactions. Give your health care provider a list of all   the  medicines, herbs, non-prescription drugs, or dietary supplements you use. Also tell them if you smoke, drink alcohol, or use illegal drugs. Some items may interact with your medicine. This list may not describe all possible interactions. Give your health care provider a list of all the medicines, herbs, non-prescription drugs, or dietary supplements you use. Also tell them if you smoke, drink alcohol, or use illegal drugs. Some items may interact with your medicine. What should I watch for while using this medicine? Visit your doctor or health care professional for regular checks on your progress. You will need a regular breast and pelvic exam and Pap smear while on this medicine. Use an additional method of contraception during the first cycle that you take these tablets. If you have any reason to think you are pregnant, stop taking this medicine right away and contact your doctor or health care professional. If you are taking this medicine for hormone related problems, it may take several cycles of use to see improvement in your condition. Smoking increases the risk of getting a blood clot or having a stroke while you are taking birth control pills, especially if you are more than 25 years old. You are strongly advised not to smoke. This medicine can make your body retain fluid, making your fingers, hands, or ankles swell. Your blood pressure can go up. Contact your doctor or health care professional if you feel you are retaining fluid. This medicine can make you more sensitive to the sun. Keep out of the sun. If you cannot avoid being in the sun, wear protective clothing and use sunscreen. Do not use sun lamps or tanning beds/booths. If you wear contact lenses and notice visual changes, or if the lenses begin to feel uncomfortable, consult your eye care specialist. In some women, tenderness, swelling, or minor bleeding of the gums may occur. Notify your dentist if this happens. Brushing and flossing  your teeth regularly may help limit this. See your dentist regularly and inform your dentist of the medicines you are taking. If you are going to have elective surgery, you may need to stop taking this medicine before the surgery. Consult your health care professional for advice. This medicine does not protect you against HIV infection (AIDS) or any other sexually transmitted diseases. What side effects may I notice from receiving this medicine? Side effects that you should report to your doctor or health care professional as soon as possible: -allergic reactions like skin rash, itching or hives, swelling of the face, lips, or tongue -breast tissue changes or discharge -changes in vision -chest pain -confusion, trouble speaking or understanding -dark urine -general ill feeling or flu-like symptoms -light-colored stools -nausea, vomiting -pain, swelling, warmth in the leg -right upper belly pain -severe headaches -shortness of breath -sudden numbness or weakness of the face, arm or leg -trouble walking, dizziness, loss of balance or coordination -unusual vaginal bleeding -yellowing of the eyes or skin Side effects that usually do not require medical attention (report to your doctor or health care professional if they continue or are bothersome): -acne -brown spots on the face -change in appetite -change in sexual desire -depressed mood or mood swings -fluid retention and swelling -stomach cramps or bloating -unusually weak or tired -weight gain This list may not describe all possible side effects. Call your doctor for medical advice about side effects. You may report side effects to FDA at 1-800-FDA-1088. Where should I keep my medicine? Keep out of the reach of children. Store at   room temperature between 15 and 30 degrees C (59 and 86 degrees F). Throw away any unused medicine after the expiration date. NOTE: This sheet is a summary. It may not cover all possible information. If  you have questions about this medicine, talk to your doctor, pharmacist, or health care provider.  2018 Elsevier/Gold Standard (2016-02-13 13:52:56)  

## 2017-11-09 NOTE — Progress Notes (Signed)
Pt stated that her cycles have been irregular. Pt stated that she has been having 2 cycles in 1 month. Pt also stated that she has a bump on her vaginal area unknown how long it has been there just noticed it. No other concerns.

## 2017-11-10 ENCOUNTER — Encounter: Payer: Self-pay | Admitting: Obstetrics and Gynecology

## 2017-11-11 LAB — PROGESTERONE: Progesterone: 0.3 ng/mL

## 2017-11-11 LAB — TESTOSTERONE, FREE, TOTAL, SHBG
Sex Hormone Binding: 45 nmol/L (ref 24.6–122.0)
TESTOSTERONE FREE: 2.4 pg/mL (ref 0.0–4.2)
TESTOSTERONE: 24 ng/dL (ref 8–48)

## 2017-11-11 LAB — TSH: TSH: 3.51 u[IU]/mL (ref 0.450–4.500)

## 2017-11-11 LAB — FSH/LH
FSH: 6.3 m[IU]/mL
LH: 5.1 m[IU]/mL

## 2017-11-11 LAB — HEMOGLOBIN A1C
Est. average glucose Bld gHb Est-mCnc: 114 mg/dL
Hgb A1c MFr Bld: 5.6 % (ref 4.8–5.6)

## 2017-11-11 LAB — ESTRADIOL: ESTRADIOL: 25 pg/mL

## 2017-12-12 ENCOUNTER — Ambulatory Visit: Payer: BC Managed Care – PPO | Admitting: Podiatry

## 2017-12-12 ENCOUNTER — Encounter: Payer: Self-pay | Admitting: Podiatry

## 2017-12-12 DIAGNOSIS — M2580 Other specified joint disorders, unspecified joint: Secondary | ICD-10-CM

## 2017-12-12 MED ORDER — MELOXICAM 15 MG PO TABS: 15.0000 mg | ORAL_TABLET | Freq: Every day | ORAL | 1 refills | Status: AC

## 2017-12-13 ENCOUNTER — Ambulatory Visit (INDEPENDENT_AMBULATORY_CARE_PROVIDER_SITE_OTHER): Payer: BC Managed Care – PPO | Admitting: Obstetrics and Gynecology

## 2017-12-13 ENCOUNTER — Encounter: Payer: Self-pay | Admitting: Obstetrics and Gynecology

## 2017-12-13 ENCOUNTER — Encounter: Payer: BC Managed Care – PPO | Admitting: Obstetrics and Gynecology

## 2017-12-13 VITALS — BP 117/75 | HR 76 | Ht 68.0 in | Wt 240.1 lb

## 2017-12-13 DIAGNOSIS — E668 Other obesity: Secondary | ICD-10-CM

## 2017-12-13 DIAGNOSIS — R339 Retention of urine, unspecified: Secondary | ICD-10-CM | POA: Diagnosis not present

## 2017-12-13 DIAGNOSIS — Z01419 Encounter for gynecological examination (general) (routine) without abnormal findings: Secondary | ICD-10-CM

## 2017-12-13 LAB — POCT URINALYSIS DIPSTICK
BILIRUBIN UA: NEGATIVE
GLUCOSE UA: NEGATIVE
KETONES UA: NEGATIVE
LEUKOCYTES UA: NEGATIVE
Nitrite, UA: NEGATIVE
Protein, UA: NEGATIVE
RBC UA: NEGATIVE
Urobilinogen, UA: 0.2 E.U./dL
pH, UA: 6 (ref 5.0–8.0)

## 2017-12-13 NOTE — Progress Notes (Signed)
GYNECOLOGY ANNUAL PHYSICAL EXAM PROGRESS NOTE  Subjective:    Brittany Love is a 27 y.o. G0P0000 female who presents for an annual exam.  The patient is not currently sexually active. The patient wears seatbelts: yes. The patient participates in regular exercise: yes (6-7 times weekly). Has the patient ever been transfused or tattooed?: no. The patient reports that there is not domestic violence in her life.   The patient has the following complaints today:  1. Patient reports feeling of incomplete bladder emptying. Notes that she goes to the restroom and voids, approximately several minutes later she feels the need to void again. States that she usually will void another small amount (several ounces). Denies dysuria, hematuria. Denies having to strain to void. This has been ongoing for several weeks.   Gynecologic History Menarche age: 16 Patient's last menstrual period was 12/05/2017. Contraception: abstinence History of STI's: Denies  Last Pap: 07/2015. Results were: normal.  Denies h/o abnormal pap smears.   OB History  Gravida Para Term Preterm AB Living  0 0 0 0 0 0  SAB TAB Ectopic Multiple Live Births  0 0 0 0 0    Past Medical History:  Diagnosis Date  . Asthma, exercise induced     Past Surgical History:  Procedure Laterality Date  . NO PAST SURGERIES      Family History  Problem Relation Age of Onset  . Hyperlipidemia Father   . Thyroid disease Father   . Diabetes Neg Hx   . Hypertension Neg Hx   . Ovarian cancer Neg Hx     Social History   Socioeconomic History  . Marital status: Single    Spouse name: Not on file  . Number of children: Not on file  . Years of education: Not on file  . Highest education level: Not on file  Occupational History  . Not on file  Social Needs  . Financial resource strain: Not on file  . Food insecurity:    Worry: Not on file    Inability: Not on file  . Transportation needs:    Medical: Not on file   Non-medical: Not on file  Tobacco Use  . Smoking status: Never Smoker  . Smokeless tobacco: Never Used  Substance and Sexual Activity  . Alcohol use: No  . Drug use: No  . Sexual activity: Never    Birth control/protection: None  Lifestyle  . Physical activity:    Days per week: Not on file    Minutes per session: Not on file  . Stress: Not on file  Relationships  . Social connections:    Talks on phone: Not on file    Gets together: Not on file    Attends religious service: Not on file    Active member of club or organization: Not on file    Attends meetings of clubs or organizations: Not on file    Relationship status: Not on file  . Intimate partner violence:    Fear of current or ex partner: Not on file    Emotionally abused: Not on file    Physically abused: Not on file    Forced sexual activity: Not on file  Other Topics Concern  . Not on file  Social History Narrative  . Not on file    Current Outpatient Medications on File Prior to Visit  Medication Sig Dispense Refill  . AFLURIA QUADRIVALENT 0.5 ML injection TO BE ADMINISTERED BY PHARMACIST FOR IMMUNIZATION  0  .  albuterol (PROVENTIL HFA) 108 (90 Base) MCG/ACT inhaler Inhale into the lungs.    . drospirenone-ethinyl estradiol (YAZ,GIANVI,LORYNA) 3-0.02 MG tablet Take 1 tablet by mouth daily. 1 Package 11  . EUCRISA 2 % OINT Apply TO affected AREA ONCE TO TWICE daily  2  . fluticasone (FLONASE) 50 MCG/ACT nasal spray Place into the nose.    . meloxicam (MOBIC) 15 MG tablet Take 1 tablet (15 mg total) by mouth daily. 60 tablet 1  . mometasone (ELOCON) 0.1 % cream APPLY TO AFFECTED AREA 5 DAYS PER WEEK AS NEEDED  2  . NEOMYCIN-POLYMYXIN-HYDROCORTISONE (CORTISPORIN) 1 % SOLN OTIC solution Apply 1-2 drops to toe BID after soaking 10 mL 1   No current facility-administered medications on file prior to visit.     No Known Allergies   Review of Systems Constitutional: negative for chills, fatigue, fevers and  sweats Eyes: negative for irritation, redness and visual disturbance Ears, nose, mouth, throat, and face: negative for hearing loss, nasal congestion, snoring and tinnitus Respiratory: negative for asthma, cough, sputum Cardiovascular: negative for chest pain, dyspnea, exertional chest pressure/discomfort, irregular heart beat, palpitations and syncope Gastrointestinal: negative for abdominal pain, change in bowel habits, nausea and vomiting Genitourinary: negative for abnormal menstrual periods, genital lesions, sexual problems and vaginal discharge, dysuria and urinary incontinence. Positive for incomplete bladder emptying.  Integument/breast: negative for breast lump, breast tenderness and nipple discharge Hematologic/lymphatic: negative for bleeding and easy bruising Musculoskeletal:negative for back pain and muscle weakness Neurological: negative for dizziness, headaches, vertigo and weakness Endocrine: negative for diabetic symptoms including polydipsia, polyuria and skin dryness Allergic/Immunologic: negative for hay fever and urticaria       Objective:  Blood pressure 117/75, pulse 76, height 5\' 8"  (1.727 m), weight 240 lb 1.6 oz (108.9 kg), last menstrual period 12/05/2017. Body mass index is 36.51 kg/m.   General Appearance:    Alert, cooperative, no distress, appears stated age, morbid obesity   Head:    Normocephalic, without obvious abnormality, atraumatic  Eyes:    PERRL, conjunctiva/corneas clear, EOM's intact, both eyes  Ears:    Normal external ear canals, both ears  Nose:   Nares normal, septum midline, mucosa normal, no drainage or sinus tenderness  Throat:   Lips, mucosa, and tongue normal; teeth and gums normal  Neck:   Supple, symmetrical, trachea midline, no adenopathy; thyroid: no enlargement/tenderness/nodules; no carotid bruit or JVD  Back:     Symmetric, no curvature, ROM normal, no CVA tenderness  Lungs:     Clear to auscultation bilaterally, respirations  unlabored  Chest Wall:    No tenderness or deformity   Heart:    Regular rate and rhythm, S1 and S2 normal, no murmur, rub or gallop  Breast Exam:    No tenderness, masses, or nipple abnormality  Abdomen:     Soft, non-tender, bowel sounds active all four quadrants, no masses, no organomegaly.    Genitalia:    Pelvic:external genitalia normal.  Vagina without lesions, discharge, or tenderness, rectovaginal septum  normal. Cervix normal in appearance, no cervical motion tenderness, no adnexal masses or tenderness.  Uterus normal size, shape, mobile, regular contours, nontender.  Rectal:    Normal external sphincter.  No hemorrhoids appreciated. Internal exam not done.   Extremities:   Extremities normal, atraumatic, no cyanosis or edema  Pulses:   2+ and symmetric all extremities  Skin:   Skin color, texture, turgor normal, no rashes or lesions  Lymph nodes:   Cervical, supraclavicular, and axillary nodes  normal  Neurologic:   CNII-XII intact, normal strength, sensation and reflexes throughout    Labs:  Lab Results  Component Value Date   WBC 6.3 10/01/2016   HGB 12.7 10/01/2016   HCT 39.7 10/01/2016   MCV 82 10/01/2016   PLT 236 10/01/2016    Lab Results  Component Value Date   CREATININE 0.74 10/01/2016   BUN 16 10/01/2016   NA 138 10/01/2016   K 4.7 10/01/2016   CL 101 10/01/2016   CO2 25 10/01/2016    Lab Results  Component Value Date   ALT 21 10/01/2016   AST 22 10/01/2016   ALKPHOS 68 10/01/2016   BILITOT 0.2 10/01/2016   Lab Results  Component Value Date   CHOL 194 10/01/2016   HDL 46 10/01/2016   LDLCALC 129 (H) 10/01/2016   TRIG 93 10/01/2016   CHOLHDL 4.2 10/01/2016     Lab Results  Component Value Date   TSH 3.510 11/09/2017      Results for orders placed or performed in visit on 12/13/17  POCT urinalysis dipstick  Result Value Ref Range   Color, UA yellow    Clarity, UA clear    Glucose, UA Negative Negative   Bilirubin, UA neg    Ketones,  UA neg    Spec Grav, UA <=1.005 (A) 1.010 - 1.025   Blood, UA neg    pH, UA 6.0 5.0 - 8.0   Protein, UA Negative Negative   Urobilinogen, UA 0.2 0.2 or 1.0 E.U./dL   Nitrite, UA neg    Leukocytes, UA Negative Negative   Appearance yellow    Odor      Assessment:    Healthy female exam.  Incomplete bladder emptying  Moderate obesity  Plan:     Blood tests: CBC with diff, Comprehensive metabolic panel. Breast self exam technique reviewed and patient encouraged to perform self-exam monthly. Contraception: OCPs (combined). Discussed healthy lifestyle modifications. Pap smear up to date. Due in 1 year.  Reiterated importance of HPV vaccine.  Handout given.  Incomplete bladder emptying, will order bladder scan. Discussed differential, need for further evaluation based on imaging results and management options discussed.  Follow up in 1 year for annual exam.   Hildred Laser, MD [facility]

## 2017-12-13 NOTE — Patient Instructions (Signed)

## 2017-12-13 NOTE — Progress Notes (Signed)
Pt stated that after she use the bathroom to urine she feels like she has not emptied her bladder.

## 2017-12-14 LAB — COMPREHENSIVE METABOLIC PANEL
A/G RATIO: 1.4 (ref 1.2–2.2)
ALK PHOS: 55 IU/L (ref 39–117)
ALT: 15 IU/L (ref 0–32)
AST: 16 IU/L (ref 0–40)
Albumin: 4.2 g/dL (ref 3.5–5.5)
BUN/Creatinine Ratio: 21 (ref 9–23)
BUN: 18 mg/dL (ref 6–20)
Bilirubin Total: 0.3 mg/dL (ref 0.0–1.2)
CHLORIDE: 100 mmol/L (ref 96–106)
CO2: 23 mmol/L (ref 20–29)
Calcium: 9.7 mg/dL (ref 8.7–10.2)
Creatinine, Ser: 0.85 mg/dL (ref 0.57–1.00)
GFR calc Af Amer: 111 mL/min/{1.73_m2} (ref >59)
GFR calc non Af Amer: 96 mL/min/{1.73_m2} (ref >59)
GLOBULIN, TOTAL: 2.9 g/dL (ref 1.5–4.5)
Glucose: 93 mg/dL (ref 65–99)
POTASSIUM: 4.4 mmol/L (ref 3.5–5.2)
SODIUM: 138 mmol/L (ref 134–144)
Total Protein: 7.1 g/dL (ref 6.0–8.5)

## 2017-12-14 LAB — CBC
Hematocrit: 38.8 % (ref 34.0–46.6)
Hemoglobin: 12 g/dL (ref 11.1–15.9)
MCH: 23.6 pg — ABNORMAL LOW (ref 26.6–33.0)
MCHC: 30.9 g/dL — AB (ref 31.5–35.7)
MCV: 76 fL — AB (ref 79–97)
Platelets: 320 10*3/uL (ref 150–450)
RBC: 5.08 x10E6/uL (ref 3.77–5.28)
RDW: 15.8 % — AB (ref 12.3–15.4)
WBC: 14.2 10*3/uL — ABNORMAL HIGH (ref 3.4–10.8)

## 2017-12-15 NOTE — Progress Notes (Signed)
   HPI: 27 year old female presenting today for follow up evaluation of plantar fasciitis of the left foot and fibular sesamoiditis of the right foot. She states the left foot pain is now intermittent and not as severe as previously. She states the pain of the right foot worsened in the past few days. Walking for long periods of time increases the pain. She has not done anything for treatment. Patient is here for further evaluation and treatment.   Past Medical History:  Diagnosis Date  . Asthma, exercise induced      Physical Exam: General: The patient is alert and oriented x3 in no acute distress.  Dermatology: Skin is warm, dry and supple bilateral lower extremities. Negative for open lesions or macerations.  Vascular: Palpable pedal pulses bilaterally. No edema or erythema noted. Capillary refill within normal limits.  Neurological: Epicritic and protective threshold grossly intact bilaterally.   Musculoskeletal Exam: Pain with palpation to the right sesamoid apparatus. Range of motion within normal limits to all pedal and ankle joints bilateral. Muscle strength 5/5 in all groups bilateral.   Assessment: 1. Fibular sesamoiditis right 2. Plantar fasciitis left - resolved    Plan of Care:  1. Patient evaluated.  2. Injection of 0.5 mLs of Celestone Soluspan injected into the sesamoidal apparatus. 3. Prescription for Meloxicam provided to patient.  4. Continue wearing good shoe gear.  5. Return to clinic as needed.      Felecia ShellingBrent M. Itxel Wickard, DPM [facility]  Dr. Felecia ShellingBrent M. Haunani Dickard, DPM    [address]                                        , [place] [id number]                Office [phone]  Fax [phone]) [phone]

## 2017-12-26 ENCOUNTER — Ambulatory Visit: Payer: BC Managed Care – PPO | Admitting: Podiatry

## 2017-12-26 ENCOUNTER — Ambulatory Visit: Payer: BC Managed Care – PPO | Admitting: Psychology

## 2017-12-26 DIAGNOSIS — F411 Generalized anxiety disorder: Secondary | ICD-10-CM

## 2017-12-27 ENCOUNTER — Encounter: Payer: Self-pay | Admitting: Podiatry

## 2017-12-27 ENCOUNTER — Ambulatory Visit: Payer: BC Managed Care – PPO | Admitting: Podiatry

## 2017-12-27 ENCOUNTER — Telehealth: Payer: Self-pay | Admitting: *Deleted

## 2017-12-27 DIAGNOSIS — M2580 Other specified joint disorders, unspecified joint: Secondary | ICD-10-CM | POA: Diagnosis not present

## 2017-12-27 NOTE — Telephone Encounter (Signed)
Orders given to J. Quintana, RN for pre-cert and faxed to [facility]. 

## 2017-12-27 NOTE — Telephone Encounter (Signed)
-----   Message from Kristian CoveyAshley E Prevette, [facility] sent at 12/27/2017  3:37 PM EDT ----- Regarding: MRI  MRI right foot - evaluate fibular sesamoid fracture and AVN right - surgical consideration         # for future reference, I asked him and if he (or I) say rearfoot, always just order for ankle!! :)

## 2017-12-28 ENCOUNTER — Telehealth: Payer: Self-pay | Admitting: Podiatry

## 2017-12-28 NOTE — Telephone Encounter (Signed)
I informed pt [facility] and she could schedule with them and we suggest 5-7 business days from her last office visit to allow for the MRI to be pre-cert with her insurance. Pt asked if it could be further out and I told her that would be fine.

## 2017-12-28 NOTE — Progress Notes (Signed)
She presents today and states that the injection to Dr. Logan BoresEvans began lasted just about a week she states is really starting to bother her once again.  This is been going on quite some time now and she is concerned.  Objective: Vital signs are stable she is alert and oriented x3 still has significant pain on palpation of the fibular sesamoid right foot pulses remain sharply palpable.  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  Plan: All conservative therapies have failed for her I do feel that an MRI will be necessary however I am also going to go ahead and get her an orthotic built to help offload this area so she can at least work.  Should the MRI demonstrate AVN or nonunion of this fibular sesamoid excision would be recommended.

## 2017-12-28 NOTE — Telephone Encounter (Signed)
I saw Dr. Al CorpusHyatt yesterday and we talked about me getting an MRI. I wanted to just double check that you all will contact the imaging center first, not me? If you could just let me know if I need to do anything or just wait from a call from the imaging center, that would be great. Thank you.

## 2018-01-02 ENCOUNTER — Other Ambulatory Visit: Payer: BC Managed Care – PPO

## 2018-01-05 ENCOUNTER — Ambulatory Visit: Payer: BC Managed Care – PPO | Admitting: Internal Medicine

## 2018-01-09 ENCOUNTER — Ambulatory Visit
Admission: RE | Admit: 2018-01-09 | Discharge: 2018-01-09 | Disposition: A | Discharge status: Home / Self Care | Payer: BC Managed Care – PPO | Source: Ambulatory Visit | Attending: Podiatry | Admitting: Podiatry

## 2018-01-09 ENCOUNTER — Encounter: Payer: Self-pay | Admitting: Internal Medicine

## 2018-01-09 ENCOUNTER — Ambulatory Visit: Payer: BC Managed Care – PPO | Admitting: Internal Medicine

## 2018-01-09 VITALS — BP 116/74 | HR 74 | Temp 98.5°F | Ht 68.5 in | Wt 236.0 lb

## 2018-01-09 DIAGNOSIS — Z23 Encounter for immunization: Secondary | ICD-10-CM | POA: Diagnosis not present

## 2018-01-09 DIAGNOSIS — M79674 Pain in right toe(s): Secondary | ICD-10-CM

## 2018-01-09 DIAGNOSIS — M2580 Other specified joint disorders, unspecified joint: Secondary | ICD-10-CM

## 2018-01-09 DIAGNOSIS — J45990 Exercise induced bronchospasm: Secondary | ICD-10-CM

## 2018-01-09 DIAGNOSIS — L309 Dermatitis, unspecified: Secondary | ICD-10-CM

## 2018-01-09 NOTE — Assessment & Plan Note (Signed)
Continue Mometasone, Eucrisa and HPR She will continue to follow with dermatology

## 2018-01-09 NOTE — Assessment & Plan Note (Signed)
She declines RX for Albuterol today

## 2018-01-09 NOTE — Progress Notes (Signed)
HPI  Pt presents to the clinic today to establish care and for management of the conditions listed below. She has not had a PCP in many years.  Eczema: Mainly on her hands, worse in the winter months. She uses Mometasone, Ecurisa and HPR. She follows with Dr. Gwen PoundsKowalski.  Exercise Induced Asthma: She reports she has not had an Albuterol in a few years and does not think she needs this refilled today.  She also reports persistent right great toe pain. She is taking Meloxicam daily, scheduled for a MRI later today. She follows with Dr. Al CorpusHyatt.  Flu: 03/2017 Tetanus: 2008 Pap Smear: 2017, [facility] Dentist: biannually   Past Medical History:  Diagnosis Date  . Asthma, exercise induced     Current Outpatient Medications  Medication Sig Dispense Refill  . drospirenone-ethinyl estradiol (YAZ,GIANVI,LORYNA) 3-0.02 MG tablet Take 1 tablet by mouth daily. 1 Package 11  . EUCRISA 2 % OINT Apply TO affected AREA ONCE TO TWICE daily  2  . meloxicam (MOBIC) 15 MG tablet Take 1 tablet (15 mg total) by mouth daily. 60 tablet 1  . mometasone (ELOCON) 0.1 % cream APPLY TO AFFECTED AREA 5 DAYS PER WEEK AS NEEDED  2  . albuterol (PROVENTIL HFA) 108 (90 Base) MCG/ACT inhaler Inhale into the lungs.     No current facility-administered medications for this visit.     No Known Allergies  Family History  Problem Relation Age of Onset  . Hyperlipidemia Father   . Thyroid disease Father   . Pancreatic cancer Maternal Grandmother   . Prostate cancer Maternal Grandfather   . Diabetes Paternal Grandfather   . Hypertension Neg Hx   . Ovarian cancer Neg Hx     Social History   Socioeconomic History  . Marital status: Single    Spouse name: Not on file  . Number of children: Not on file  . Years of education: Not on file  . Highest education level: Not on file  Occupational History  . Not on file  Social Needs  . Financial resource strain: Not on file  . Food insecurity:    Worry: Not on file     Inability: Not on file  . Transportation needs:    Medical: Not on file    Non-medical: Not on file  Tobacco Use  . Smoking status: Never Smoker  . Smokeless tobacco: Never Used  Substance and Sexual Activity  . Alcohol use: No  . Drug use: No  . Sexual activity: Never    Birth control/protection: None  Lifestyle  . Physical activity:    Days per week: Not on file    Minutes per session: Not on file  . Stress: Not on file  Relationships  . Social connections:    Talks on phone: Not on file    Gets together: Not on file    Attends religious service: Not on file    Active member of club or organization: Not on file    Attends meetings of clubs or organizations: Not on file    Relationship status: Not on file  . Intimate partner violence:    Fear of current or ex partner: Not on file    Emotionally abused: Not on file    Physically abused: Not on file    Forced sexual activity: Not on file  Other Topics Concern  . Not on file  Social History Narrative  . Not on file    ROS:  Constitutional: Denies fever, malaise, fatigue, headache  or abrupt weight changes.  HEENT: Denies eye pain, eye redness, ear pain, ringing in the ears, wax buildup, runny nose, nasal congestion, bloody nose, or sore throat. Respiratory: Denies difficulty breathing, shortness of breath, cough or sputum production.   Cardiovascular: Denies chest pain, chest tightness, palpitations or swelling in the hands or feet.  Gastrointestinal: Denies abdominal pain, bloating, constipation, diarrhea or blood in the stool.  GU: Denies frequency, urgency, pain with urination, blood in urine, odor or discharge. Musculoskeletal: Pt reports right great toe pain. Denies decrease in range of motion, difficulty with gait, muscle pain or joint swelling.  Skin: Denies redness, rashes, lesions or ulcercations.  Neurological: Denies dizziness, difficulty with memory, difficulty with speech or problems with balance and  coordination.  Psych: Denies anxiety, depression, SI/HI.  No other specific complaints in a complete review of systems (except as listed in HPI above).  PE:  BP 116/74   Pulse 74   Temp 98.5 F (36.9 C) (Oral)   Ht 5' 8.5" (1.74 m)   Wt 236 lb (107 kg)   LMP 01/06/2018   SpO2 98%   BMI 35.36 kg/m  Wt Readings from Last 3 Encounters:  01/09/18 236 lb (107 kg)  12/13/17 240 lb 1.6 oz (108.9 kg)  11/09/17 251 lb 12.8 oz (114.2 kg)    General: Appears her stated age, obese in NAD. HCardiovascular: Normal rate and rhythm. S1,S2 noted.  No murmur, rubs or gallops noted.  Pulmonary/Chest: Normal effort and positive vesicular breath sounds. No respiratory distress. No wheezes, rales or ronchi noted.  Musculoskeletal: No difficulty with gait.  Neurological: Alert and oriented.  Psychiatric: Mood and affect normal. Behavior is normal. Judgment and thought content normal.    BMET    Component Value Date/Time   NA 138 12/13/2017 1013   K 4.4 12/13/2017 1013   CL 100 12/13/2017 1013   CO2 23 12/13/2017 1013   GLUCOSE 93 12/13/2017 1013   BUN 18 12/13/2017 1013   CREATININE 0.85 12/13/2017 1013   CALCIUM 9.7 12/13/2017 1013   GFRNONAA 96 12/13/2017 1013   GFRAA 111 12/13/2017 1013    Lipid Panel     Component Value Date/Time   CHOL 194 10/01/2016 0906   TRIG 93 10/01/2016 0906   HDL 46 10/01/2016 0906   CHOLHDL 4.2 10/01/2016 0906   LDLCALC 129 (H) 10/01/2016 0906    CBC    Component Value Date/Time   WBC 14.2 (H) 12/13/2017 1013   RBC 5.08 12/13/2017 1013   HGB 12.0 12/13/2017 1013   HCT 38.8 12/13/2017 1013   PLT 320 12/13/2017 1013   MCV 76 (L) 12/13/2017 1013   MCH 23.6 (L) 12/13/2017 1013   MCHC 30.9 (L) 12/13/2017 1013   RDW 15.8 (H) 12/13/2017 1013    Hgb A1C Lab Results  Component Value Date   HGBA1C 5.6 11/09/2017     Assessment and Plan:  Right Great Toe Pain:  Proceed with MRI as scheduled Continue Meloxicam She will continue to follow  with Dr. Al Corpus.  Make an appt for your annual exam Nicki Reaper, NP

## 2018-01-09 NOTE — Patient Instructions (Signed)

## 2018-01-11 ENCOUNTER — Telehealth: Payer: Self-pay | Admitting: Podiatry

## 2018-01-11 ENCOUNTER — Telehealth: Payer: Self-pay | Admitting: *Deleted

## 2018-01-11 NOTE — Telephone Encounter (Signed)
Left message informing pt that we were indeed sending the MRI disc to the radiology specialist to evaluate for a fracture.

## 2018-01-11 NOTE — Addendum Note (Signed)
Addended by: Roena MaladyEVONTENNO, Ixchel Duck Y on: 01/11/2018 09:07 AM   Modules accepted: Orders

## 2018-01-11 NOTE — Telephone Encounter (Signed)
Left message informing pt of Dr. Geryl RankinsHyatt's review of results and request to send copy of MRI disc to radiology specialist for more indepth information to plan treatment.

## 2018-01-11 NOTE — Telephone Encounter (Signed)
-----   Message from Elinor ParkinsonMax T Hyatt, [place] sent at 01/10/2018  7:19 AM EDT ----- Send for over read and ask them to look around the first MTPJ and sesamoid apparatus.

## 2018-01-11 NOTE — Telephone Encounter (Signed)
-----   Message from Max T Hyatt, DPM sent at 01/10/2018  7:19 AM EDT ----- Send for over read and ask them to look around the first MTPJ and sesamoid apparatus. 

## 2018-01-11 NOTE — Telephone Encounter (Signed)
[facility] called and states a front reception staffer signed for the MRI disc. I found the disc in a fax folder to me.Mailed a copy of the MRI disc to [facility].

## 2018-01-11 NOTE — Telephone Encounter (Signed)
I'm a pt of Dr. Geryl RankinsHyatt's and I had an MRI done of my right foot. I received a call from [place], whom I'm assuming is one of the nurses saying they are sending it to a radiologist specialist and it would delay the results. I just wanted to know if at this time the big thing I was worried about was a fracture, has that been ruled out or is that something the specialist is going to have to determine. If you could call me back at [phone]. Thank you. Bye.

## 2018-01-11 NOTE — Telephone Encounter (Signed)
[facility] and she states she will send the copy of the disc by courier tomorrow.

## 2018-01-23 ENCOUNTER — Other Ambulatory Visit: Payer: BC Managed Care – PPO | Admitting: Orthotics

## 2018-01-24 ENCOUNTER — Encounter: Payer: BC Managed Care – PPO | Admitting: Obstetrics and Gynecology

## 2018-02-03 ENCOUNTER — Encounter: Payer: Self-pay | Admitting: Podiatry

## 2018-02-13 ENCOUNTER — Telehealth: Payer: Self-pay | Admitting: Podiatry

## 2018-02-13 NOTE — Telephone Encounter (Signed)
Left message informing pt the MRI overread was available and to make an appt to discuss.

## 2018-02-13 NOTE — Telephone Encounter (Signed)
I had an MRI done of my foot and Dr. Al Corpus had it sent out for somebody else to read it for a second opinion. I was wondering if those results were back? I got something in MyChart about the MRI the second time it was looked at but it doesn't say anything about what was found. If you could give me a call back at [phone]. Thank you.

## 2018-02-14 NOTE — Telephone Encounter (Signed)
I informed pt of Dr. Geryl Rankins review of MRI results and I recommended she keep her appt with Dr. Al Corpus to discuss treatment options.

## 2018-02-14 NOTE — Telephone Encounter (Signed)
Looks like possible fracture of the distal portion of the bipartite sesamoid

## 2018-03-06 ENCOUNTER — Ambulatory Visit: Payer: BC Managed Care – PPO | Admitting: Podiatry

## 2018-03-06 ENCOUNTER — Encounter: Payer: Self-pay | Admitting: Podiatry

## 2018-03-06 DIAGNOSIS — M2580 Other specified joint disorders, unspecified joint: Secondary | ICD-10-CM

## 2018-03-06 DIAGNOSIS — M722 Plantar fascial fibromatosis: Secondary | ICD-10-CM

## 2018-03-06 MED ORDER — MELOXICAM 15 MG PO TABS: 15.0000 mg | ORAL_TABLET | Freq: Every day | ORAL | 3 refills | Status: AC

## 2018-03-06 MED ORDER — DICLOFENAC SODIUM 1 % TD GEL: 4.0000 g | Freq: Four times a day (QID) | TRANSDERMAL | 2 refills | Status: AC

## 2018-03-06 NOTE — Progress Notes (Signed)
Subjective:   Patient ID: Brittany Love, female   DOB: 27 y.o.   MRN: [id number]   HPI    ROS      Objective:  Physical Exam       Assessment:       Plan:

## 2018-03-06 NOTE — Progress Notes (Signed)
She presents today with her mother for follow-up of her painful first metatarsal phalangeal joint.  She states that it is not bad in the mornings but tends to worsen throughout the day.  She states that she runs for exercise and that she can walk up to 14 miles a day with her job.  She states that she will be off from her job November through January.  Objective: Vital signs are stable she is alert and oriented x3.  Pulses are palpable.  Neurologic sensorium is intact degenerative flexors are intact muscle strength is normal symmetrical.  She has mild tenderness on palpation of the sesamoids.  MRI does demonstrate mild to moderate swelling in the bones as well as a possible fracture of the distal portion of a bipartite tibial sesamoid.  Assessment possible fracture of sesamoid sprain Lisfranc's ligament and stress reaction to metatarsals.  Plan: Discussed etiology pathology conservative surgical therapies at this point I recommended decreasing activity appropriate shoe gear continue meloxicam started her on diclofenac gel continue the use of the orthotics and I will follow-up with her in January.  May need to consider surgical intervention at that point.

## 2018-03-07 MED ORDER — NONFORMULARY OR COMPOUNDED ITEM: 2 refills | Status: AC

## 2018-03-07 NOTE — Addendum Note (Signed)
Addended by: Geraldine Contras D on: 03/07/2018 09:42 AM   Modules accepted: Orders

## 2018-03-13 ENCOUNTER — Telehealth: Payer: Self-pay | Admitting: Podiatry

## 2018-03-13 NOTE — Telephone Encounter (Signed)
Dr. Al Corpus prescribed me a anti inflammation cream and sent it to [facility]. Another pharmacy, [facility] called me and when I spoke to them, they said [facility] does not [facility] that get and it has to come from them but that my insurance does not cover medication through them. When I called [facility] they said they do carry the gel and they are just waiting on information from your office. If I could get it through [facility] I would prefer to do that because my insurance would cover that. I'm just a little confused on the process right now. I would appreciate it if someone could call me back at [phone]. Thank you.

## 2018-03-14 NOTE — Telephone Encounter (Signed)
I spoke with patient yesterday regarding [facility] medication.  She stated that she did not want the compound cream and if she could just get the Diclofenac gel, she will use that.   Diclofenac gel required prior authorization, which was denied this morning.  I have notified patient of denial and she wants to try the [facility] cream now.  She will contact the pharmacy to request the medication.

## 2018-04-01 ENCOUNTER — Encounter: Payer: Self-pay | Admitting: Podiatry

## 2018-05-02 ENCOUNTER — Encounter: Payer: Self-pay | Admitting: Obstetrics and Gynecology

## 2018-07-03 ENCOUNTER — Ambulatory Visit: Payer: BC Managed Care – PPO | Admitting: Podiatry

## 2018-07-05 ENCOUNTER — Encounter: Payer: Self-pay | Admitting: Podiatry

## 2018-07-05 ENCOUNTER — Ambulatory Visit: Payer: BC Managed Care – PPO | Admitting: Podiatry

## 2018-07-05 DIAGNOSIS — M778 Other enthesopathies, not elsewhere classified: Secondary | ICD-10-CM

## 2018-07-05 DIAGNOSIS — M779 Enthesopathy, unspecified: Secondary | ICD-10-CM | POA: Diagnosis not present

## 2018-07-05 NOTE — Progress Notes (Signed)
She presents today for follow-up of plantar forefoot pain in the sesamoiditis.  She states that I feel like this sesamoiditis is doing better but I am having a little tenderness between the first and second metatarsal heads as she refers to her right foot.  Objective: Vital signs are stable she is alert and oriented x3.  She is much less painful on her fibular sesamoid that she has been she has some mild tenderness about the second and third metatarsophalangeal joints which is probably some capsulitis.  She has tenderness on the palpation of the suspensory ligament of the fibular sesamoid which is more than likely causing some of the soft tissue swelling and tenderness between the first and second metatarsals.  Assessment: Sesamoiditis suspensory fibular sesamoid ligament is probably inflamed.  Plan: I feel that Raiford Noble needs to rework her orthotics making a deeper socket for the first metatarsophalangeal joint and possibly putting a metatarsal pad behind the knuckles.  To help offload the forefoot to some degree.  She is just getting too much weight and pressure across the forefoot and I feel that the the lateral edge of the inset is rubbing her fibular sesamoid.  So I would recommend either re-working these completely or making a new set for her.

## 2018-07-06 ENCOUNTER — Ambulatory Visit: Payer: BC Managed Care – PPO | Admitting: Orthotics

## 2018-07-06 DIAGNOSIS — M722 Plantar fascial fibromatosis: Secondary | ICD-10-CM

## 2018-07-06 DIAGNOSIS — M2580 Other specified joint disorders, unspecified joint: Secondary | ICD-10-CM

## 2018-07-06 DIAGNOSIS — M779 Enthesopathy, unspecified: Principal | ICD-10-CM

## 2018-07-06 DIAGNOSIS — M778 Other enthesopathies, not elsewhere classified: Secondary | ICD-10-CM

## 2018-07-06 NOTE — Progress Notes (Signed)
Adjusted f/o by adding k-wedge R and 1/16 PPT cushioning b/l at met heads 

## 2018-08-23 ENCOUNTER — Telehealth: Payer: Self-pay | Admitting: Podiatry

## 2018-08-23 NOTE — Telephone Encounter (Signed)
I'm calling to follow up with Angie about a release form she was trying to fax out. I know she was having trouble at one point and I didn't know if she was still having problems. If so, it can be e-mailed. The e-mail address is [email or web address]. Please call me and let me know what is going on.

## 2018-08-23 NOTE — Telephone Encounter (Signed)
I spoke with patient yesterday and informed her that the paperwork was faxed on 08/03/18 with confirmation that fax was received.  She stated that she thought it had been faxed, she was making sure.

## 2018-08-25 NOTE — Telephone Encounter (Signed)
I'm calling to speak with Angie about a medical release form she faxed for me. I called the [facility] office that it was faxed to and the lady I spoke to said she is the one who gets the faxes and she did not get it even though Angie has confirmation. I wanted to know if it could be re-faxed to them to the same number on the bottom of the form. She said it could also be e-mailed to her at [email or web address]. She also gave me her direct phone line if you want to call her. Her name is Darlin Drop and her number is [phone].

## 2018-08-28 NOTE — Telephone Encounter (Signed)
I just heard back from [facility]. She did get the fax but now she says the second page did not come through completely. She needs that sent again.

## 2018-08-28 NOTE — Telephone Encounter (Signed)
I called patient back to let her know that I refaxed the forms to [facility] again.  If she does not receive them this time, I requested an address to mail to [place].  Patient verbalized understanding

## 2018-10-01 ENCOUNTER — Other Ambulatory Visit: Payer: Self-pay | Admitting: Obstetrics and Gynecology

## 2018-11-29 ENCOUNTER — Other Ambulatory Visit: Payer: Self-pay

## 2018-11-29 ENCOUNTER — Ambulatory Visit: Payer: BC Managed Care – PPO | Admitting: Orthotics

## 2018-11-29 DIAGNOSIS — M778 Other enthesopathies, not elsewhere classified: Secondary | ICD-10-CM

## 2018-12-07 NOTE — Progress Notes (Signed)
Adjusted f/o for better outcome in footwear.

## 2018-12-20 ENCOUNTER — Encounter: Payer: BC Managed Care – PPO | Admitting: Obstetrics and Gynecology

## 2018-12-21 ENCOUNTER — Encounter: Payer: BC Managed Care – PPO | Admitting: Obstetrics and Gynecology

## 2019-01-29 ENCOUNTER — Encounter: Payer: Self-pay | Admitting: Podiatry

## 2019-02-01 ENCOUNTER — Encounter: Payer: Self-pay | Admitting: Podiatry

## 2019-03-14 ENCOUNTER — Ambulatory Visit: Payer: BC Managed Care – PPO | Admitting: Psychology

## 2019-07-20 ENCOUNTER — Telehealth: Payer: Self-pay | Admitting: Podiatry

## 2019-07-20 NOTE — Telephone Encounter (Signed)
Pt called back and is scheduled to see Rick on 3.29 in [place] office. She stated she just needed to add padding.

## 2019-07-20 NOTE — Telephone Encounter (Signed)
Pt left message stating she needed an appt for orthotic adjustment for march 11th if possible.   I returned call and left message that Raiford Noble is out of the office until end of march but to call to see if I can get her scheduled.

## 2019-09-03 ENCOUNTER — Ambulatory Visit: Payer: BC Managed Care – PPO | Admitting: Orthotics

## 2019-09-03 ENCOUNTER — Other Ambulatory Visit: Payer: Self-pay

## 2019-09-03 DIAGNOSIS — M778 Other enthesopathies, not elsewhere classified: Secondary | ICD-10-CM

## 2019-09-03 DIAGNOSIS — M722 Plantar fascial fibromatosis: Secondary | ICD-10-CM

## 2019-09-03 DIAGNOSIS — M2580 Other specified joint disorders, unspecified joint: Secondary | ICD-10-CM

## 2019-09-03 NOTE — Progress Notes (Signed)
refurb and shipping directly to patient home.

## 2019-09-10 ENCOUNTER — Ambulatory Visit: Payer: BC Managed Care – PPO | Admitting: Podiatrist

## 2020-03-06 IMAGING — MR MR FOOT*R* W/O CM
4 of 5 series · 26 of 40 positions shown · non-contrast
Comparison: None.

CLINICAL DATA: Four months of plantar surface, great toe pain at
base of great toe. NKI. No surgery. Two injections did not prove
helpful for long periods.

EXAM:
MRI OF THE RIGHT FOREFOOT WITHOUT CONTRAST
TECHNIQUE: Multiplanar, multisequence MR imaging of the right forefoot was
performed. No intravenous contrast was administered.

[Series 6: T2 fat-sat · coronal · 3.0mm · 0.25mm/px · 8 of 41 slices shown (1 of 2)]
[im 1/41]
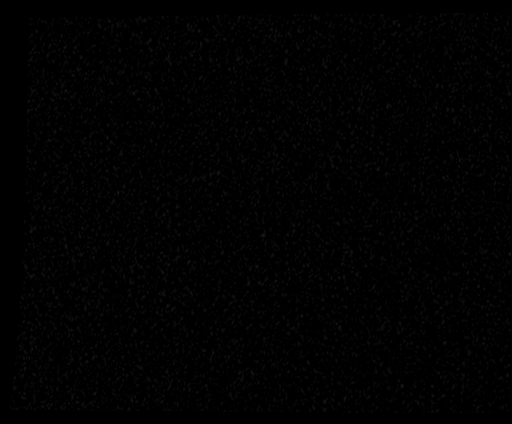
[im 5/41]
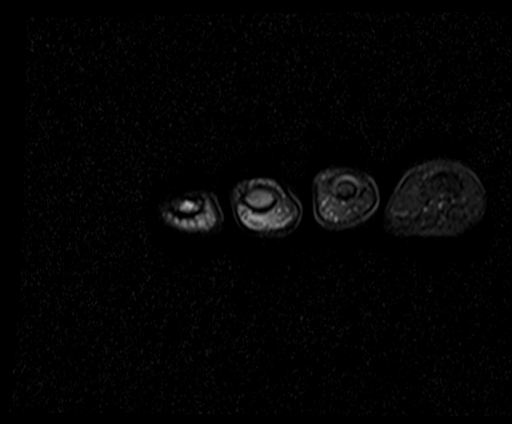
[im 14/41]
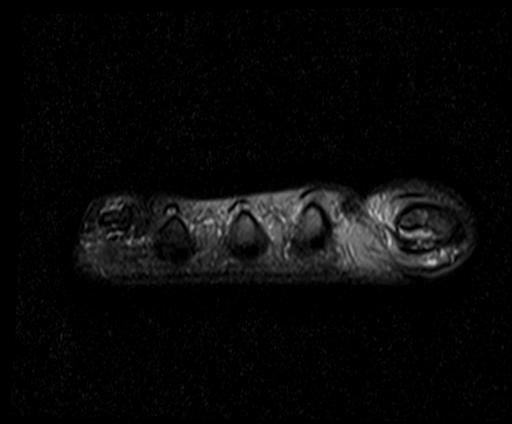
[im 18/41]
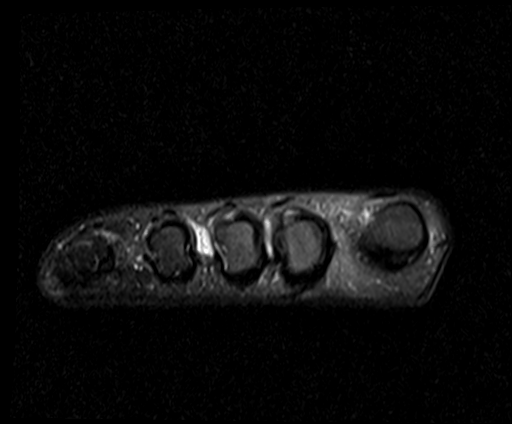
[im 23/41]
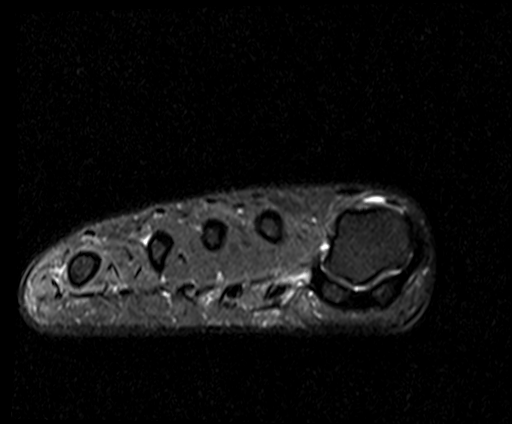
[im 27/41]
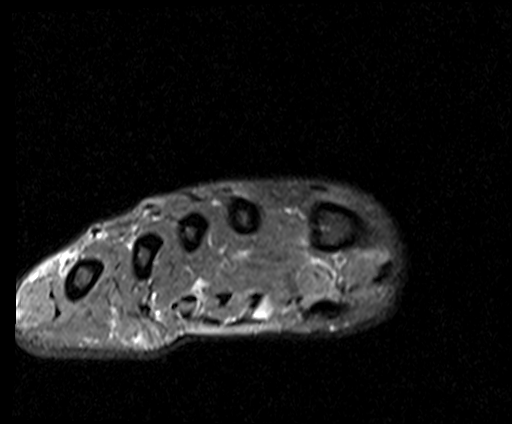
[im 36/41]
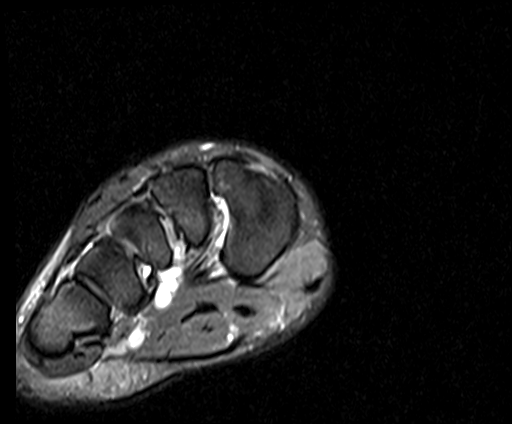
[im 41/41]
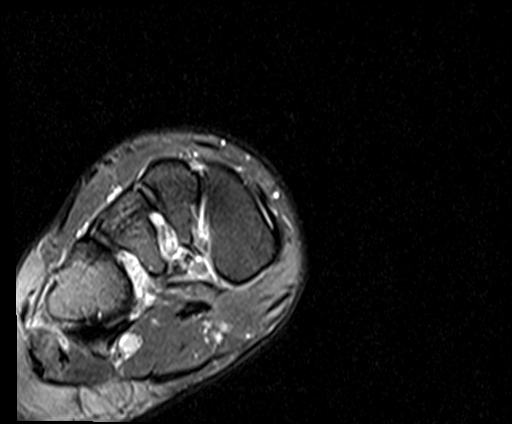

[Series 7: T1 · coronal · 3.0mm · 0.51mm/px · 8 of 40 slices shown (1 of 2)]
[im 1/40]
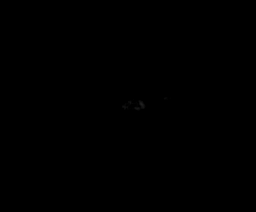
[im 5/40]
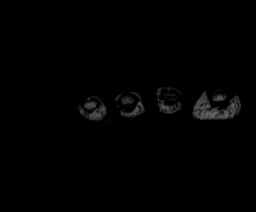
[im 14/40]
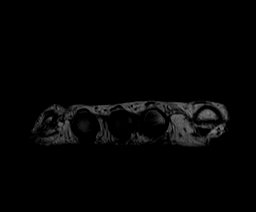
[im 18/40]
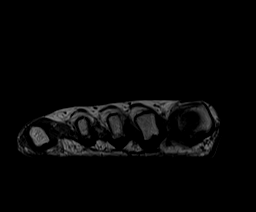
[im 22/40]
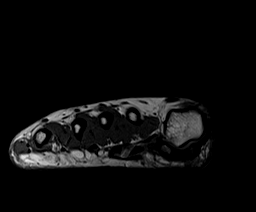
[im 27/40]
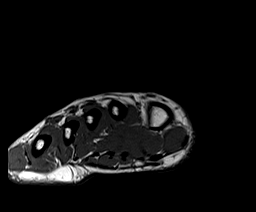
[im 35/40]
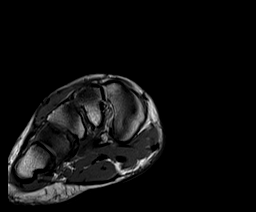
[im 40/40]
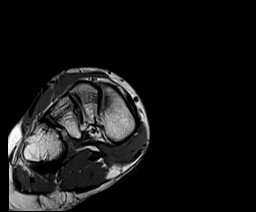

[Series 8: T1 · axial · 3.0mm · 0.35mm/px · z∈[-64,+3]mm · 4 of 23 slices shown (2 of 2)]
[im 1/23]
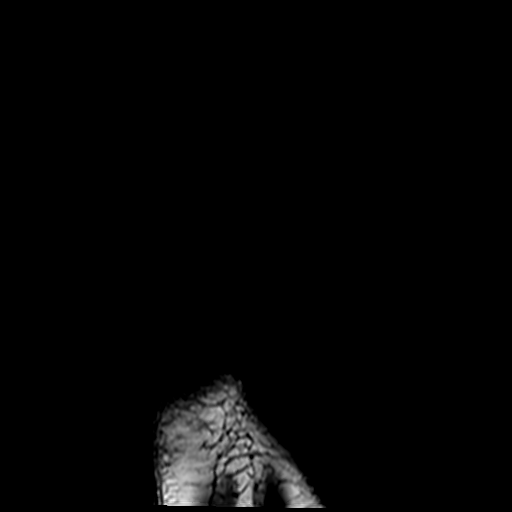
[im 5/23]
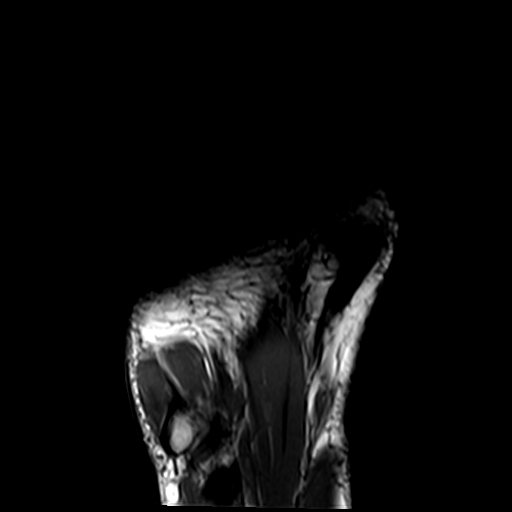
[im 14/23]
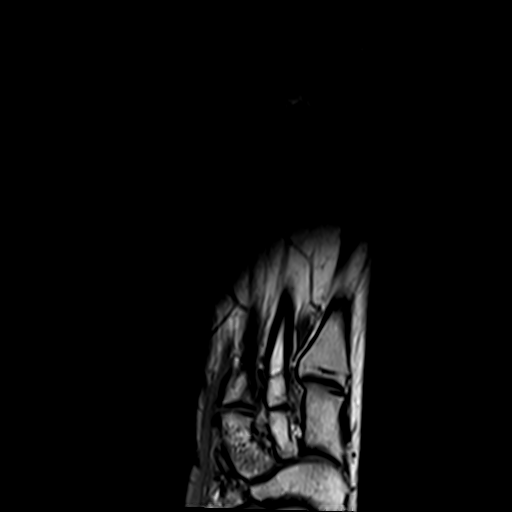
[im 23/23]
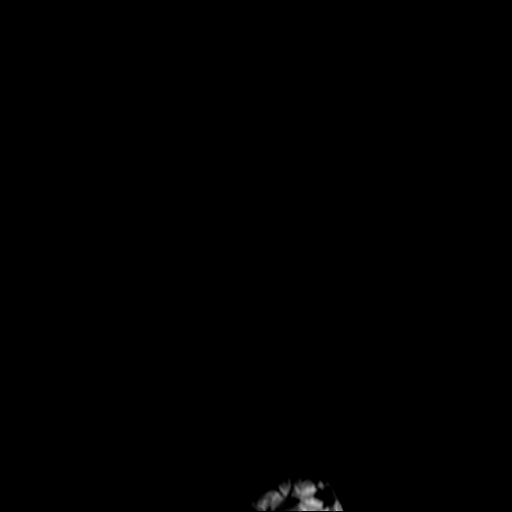

[Series 9: T2 fat-sat · axial · 3.0mm · 0.35mm/px · z∈[-64,+3]mm · 6 of 23 slices shown (2 of 2)]
[im 1/23]
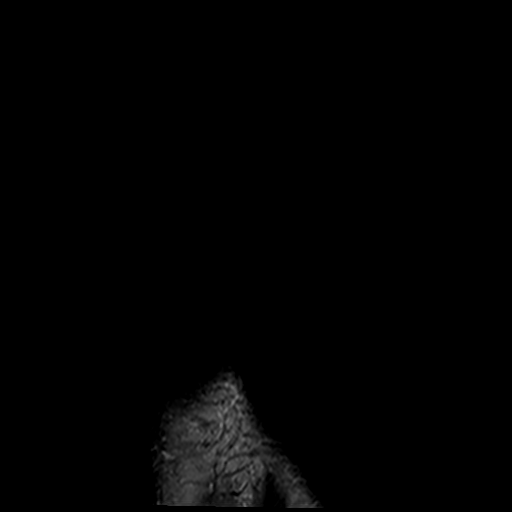
[im 5/23]
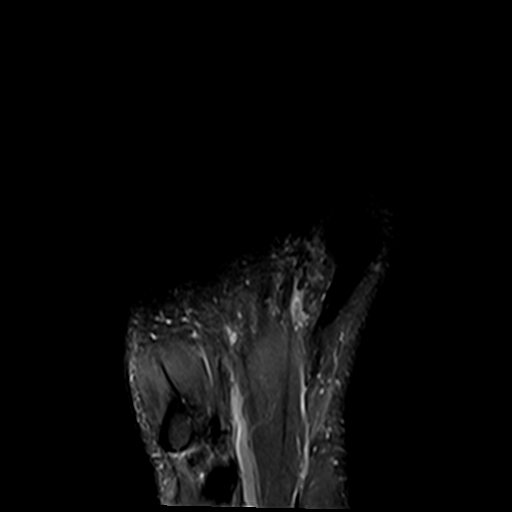
[im 9/23]
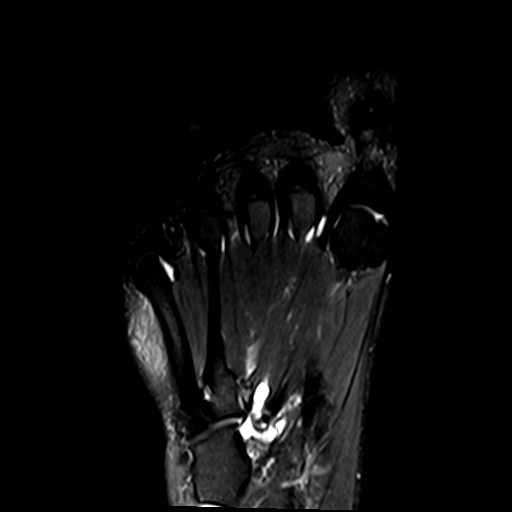
[im 14/23]
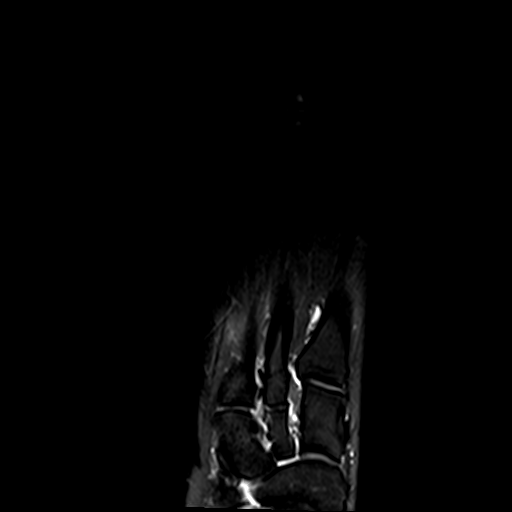
[im 18/23]
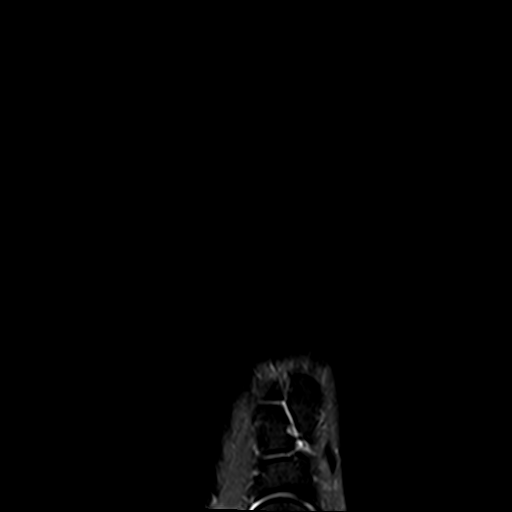
[im 23/23]
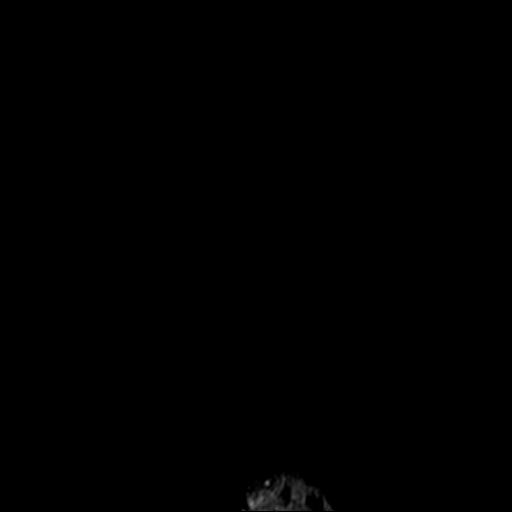

[26 of 40 positions shown; findings below may reference images not displayed]

FINDINGS: Bones/Joint/Cartilage

No marrow signal abnormality. No fracture or dislocation. Normal
alignment. Small first MTP joint effusion. Small amount of fluid in
the intermetatarsal bursa between the third and fourth metatarsal
heads.

Ligaments

Collateral ligaments are intact.  Lisfranc ligament is intact.

Muscles and Tendons
Flexor, peroneal and extensor compartment tendons are intact.
Muscles are normal.

Soft tissue
No fluid collection or hematoma.  No soft tissue mass.
IMPRESSION: 1. No acute injury of the right forefoot.

## 2021-04-08 ENCOUNTER — Telehealth: Payer: Self-pay | Admitting: Podiatry

## 2021-04-08 NOTE — Telephone Encounter (Signed)
Pt left message yesterday stating she needed an appt to get the orthotics recovered.  I returned call and explained that she does not need an appt to recover that we send them out to be refurbished and it is 90.00. She said it was mainly the left one that is peeling. I explained that if we refurbish we do both so they are the same. She did ask about shipping them to her and I told her we generally do not do that. She asked how long and I told her about 2 wks. She also stated that last time they gave her temporary inserts and I told her I could do that. She is aware of our hours.

## 2022-04-09 DIAGNOSIS — M79676 Pain in unspecified toe(s): Secondary | ICD-10-CM

## 2022-04-21 DIAGNOSIS — M779 Enthesopathy, unspecified: Secondary | ICD-10-CM

## 2022-05-05 ENCOUNTER — Telehealth: Payer: Self-pay | Admitting: Podiatry

## 2022-05-05 NOTE — Telephone Encounter (Signed)
Left message on voicemail that refurbished orthotics are back. They are currently in [place] if she would like them sent to [facility] to pick up , please call back. No Balance.
